# Patient Record
Sex: Female | Born: 1945
Health system: Southern US, Community
[De-identification: ages and names within clinical notes are randomized; demographics above are authoritative.]

## PROBLEM LIST (undated history)

## (undated) DIAGNOSIS — E785 Hyperlipidemia, unspecified: Secondary | ICD-10-CM

## (undated) DIAGNOSIS — Z973 Presence of spectacles and contact lenses: Secondary | ICD-10-CM

## (undated) DIAGNOSIS — G629 Polyneuropathy, unspecified: Secondary | ICD-10-CM

## (undated) DIAGNOSIS — I10 Essential (primary) hypertension: Secondary | ICD-10-CM

## (undated) DIAGNOSIS — R202 Paresthesia of skin: Secondary | ICD-10-CM

## (undated) DIAGNOSIS — H269 Unspecified cataract: Secondary | ICD-10-CM

## (undated) DIAGNOSIS — K635 Polyp of colon: Secondary | ICD-10-CM

## (undated) DIAGNOSIS — M1990 Unspecified osteoarthritis, unspecified site: Secondary | ICD-10-CM

## (undated) DIAGNOSIS — K5790 Diverticulosis of intestine, part unspecified, without perforation or abscess without bleeding: Secondary | ICD-10-CM

## (undated) DIAGNOSIS — K219 Gastro-esophageal reflux disease without esophagitis: Secondary | ICD-10-CM

## (undated) HISTORY — PX: COLONOSCOPY: SHX174

## (undated) HISTORY — DX: Unspecified osteoarthritis, unspecified site: M19.90

## (undated) HISTORY — DX: Polyp of colon: K63.5

## (undated) HISTORY — DX: Unspecified cataract: H26.9

## (undated) HISTORY — DX: Paresthesia of skin: R20.2

## (undated) HISTORY — PX: OTHER SURGICAL HISTORY: SHX169

## (undated) HISTORY — DX: Essential (primary) hypertension: I10

## (undated) HISTORY — DX: Hyperlipidemia, unspecified: E78.5

## (undated) HISTORY — PX: HAND SURGERY: SHX662

## (undated) HISTORY — DX: Diverticulosis of intestine, part unspecified, without perforation or abscess without bleeding: K57.90

---

## 1985-11-11 HISTORY — PX: ABDOMINAL HYSTERECTOMY: SHX81

## 1998-02-23 ENCOUNTER — Encounter: Admission: RE | Admit: 1998-02-23 | Discharge: 1998-05-24 | Payer: Self-pay | Admitting: Internal Medicine

## 1999-07-10 ENCOUNTER — Other Ambulatory Visit: Admission: RE | Admit: 1999-07-10 | Discharge: 1999-07-10 | Payer: Self-pay | Admitting: Family Medicine

## 2000-02-01 ENCOUNTER — Encounter: Admission: RE | Admit: 2000-02-01 | Discharge: 2000-02-01 | Payer: Self-pay | Admitting: Family Medicine

## 2000-02-01 ENCOUNTER — Encounter: Payer: Self-pay | Admitting: Family Medicine

## 2001-02-02 ENCOUNTER — Encounter: Payer: Self-pay | Admitting: Family Medicine

## 2001-02-02 ENCOUNTER — Encounter: Admission: RE | Admit: 2001-02-02 | Discharge: 2001-02-02 | Payer: Self-pay | Admitting: Family Medicine

## 2002-02-08 ENCOUNTER — Encounter: Admission: RE | Admit: 2002-02-08 | Discharge: 2002-02-08 | Payer: Self-pay | Admitting: Family Medicine

## 2002-02-08 ENCOUNTER — Encounter: Payer: Self-pay | Admitting: Family Medicine

## 2002-02-12 ENCOUNTER — Encounter: Admission: RE | Admit: 2002-02-12 | Discharge: 2002-02-12 | Payer: Self-pay | Admitting: Family Medicine

## 2002-02-12 ENCOUNTER — Encounter: Payer: Self-pay | Admitting: Family Medicine

## 2003-03-14 ENCOUNTER — Encounter: Payer: Self-pay | Admitting: Family Medicine

## 2003-03-14 ENCOUNTER — Encounter: Admission: RE | Admit: 2003-03-14 | Discharge: 2003-03-14 | Payer: Self-pay | Admitting: Family Medicine

## 2003-06-24 ENCOUNTER — Other Ambulatory Visit: Admission: RE | Admit: 2003-06-24 | Discharge: 2003-06-24 | Payer: Self-pay | Admitting: Family Medicine

## 2003-09-16 ENCOUNTER — Encounter: Payer: Self-pay | Admitting: Internal Medicine

## 2003-09-16 DIAGNOSIS — K5730 Diverticulosis of large intestine without perforation or abscess without bleeding: Secondary | ICD-10-CM | POA: Insufficient documentation

## 2003-09-16 DIAGNOSIS — D126 Benign neoplasm of colon, unspecified: Secondary | ICD-10-CM | POA: Insufficient documentation

## 2004-04-10 ENCOUNTER — Encounter: Admission: RE | Admit: 2004-04-10 | Discharge: 2004-04-10 | Payer: Self-pay | Admitting: Family Medicine

## 2004-09-04 ENCOUNTER — Other Ambulatory Visit: Admission: RE | Admit: 2004-09-04 | Discharge: 2004-09-04 | Payer: Self-pay | Admitting: Family Medicine

## 2004-09-10 ENCOUNTER — Encounter: Admission: RE | Admit: 2004-09-10 | Discharge: 2004-09-10 | Payer: Self-pay | Admitting: Family Medicine

## 2005-10-28 ENCOUNTER — Encounter: Admission: RE | Admit: 2005-10-28 | Discharge: 2005-10-28 | Payer: Self-pay | Admitting: Family Medicine

## 2006-11-06 ENCOUNTER — Encounter: Admission: RE | Admit: 2006-11-06 | Discharge: 2006-11-06 | Payer: Self-pay | Admitting: Family Medicine

## 2007-09-05 ENCOUNTER — Emergency Department (HOSPITAL_COMMUNITY): Admission: EM | Admit: 2007-09-05 | Discharge: 2007-09-05 | Payer: Self-pay | Admitting: Emergency Medicine

## 2007-09-09 ENCOUNTER — Encounter: Admission: RE | Admit: 2007-09-09 | Discharge: 2007-09-09 | Payer: Self-pay | Admitting: Internal Medicine

## 2007-12-04 ENCOUNTER — Encounter: Admission: RE | Admit: 2007-12-04 | Discharge: 2007-12-04 | Payer: Self-pay | Admitting: *Deleted

## 2008-10-19 DIAGNOSIS — E785 Hyperlipidemia, unspecified: Secondary | ICD-10-CM | POA: Insufficient documentation

## 2008-10-19 DIAGNOSIS — E119 Type 2 diabetes mellitus without complications: Secondary | ICD-10-CM | POA: Insufficient documentation

## 2008-10-24 ENCOUNTER — Ambulatory Visit: Payer: Self-pay | Admitting: Internal Medicine

## 2008-10-24 DIAGNOSIS — Z860100 Personal history of colon polyps, unspecified: Secondary | ICD-10-CM | POA: Insufficient documentation

## 2008-10-24 DIAGNOSIS — R197 Diarrhea, unspecified: Secondary | ICD-10-CM | POA: Insufficient documentation

## 2008-10-25 ENCOUNTER — Telehealth: Payer: Self-pay | Admitting: Internal Medicine

## 2008-10-27 ENCOUNTER — Ambulatory Visit: Payer: Self-pay | Admitting: Internal Medicine

## 2008-12-09 ENCOUNTER — Encounter: Admission: RE | Admit: 2008-12-09 | Discharge: 2008-12-09 | Payer: Self-pay | Admitting: Internal Medicine

## 2009-10-26 DIAGNOSIS — I10 Essential (primary) hypertension: Secondary | ICD-10-CM | POA: Insufficient documentation

## 2009-12-19 ENCOUNTER — Encounter: Admission: RE | Admit: 2009-12-19 | Discharge: 2009-12-19 | Payer: Self-pay | Admitting: Internal Medicine

## 2010-02-26 ENCOUNTER — Ambulatory Visit: Payer: Self-pay | Admitting: Surgery

## 2010-06-25 ENCOUNTER — Ambulatory Visit: Payer: Self-pay | Admitting: Surgery

## 2010-09-14 DIAGNOSIS — M179 Osteoarthritis of knee, unspecified: Secondary | ICD-10-CM | POA: Insufficient documentation

## 2010-12-04 ENCOUNTER — Other Ambulatory Visit: Payer: Self-pay | Admitting: Internal Medicine

## 2010-12-04 DIAGNOSIS — Z1239 Encounter for other screening for malignant neoplasm of breast: Secondary | ICD-10-CM

## 2010-12-27 ENCOUNTER — Ambulatory Visit
Admission: RE | Admit: 2010-12-27 | Discharge: 2010-12-27 | Disposition: A | Discharge status: Home / Self Care | Payer: BC Managed Care – PPO | Source: Ambulatory Visit | Attending: Internal Medicine | Admitting: Internal Medicine

## 2010-12-27 DIAGNOSIS — Z1239 Encounter for other screening for malignant neoplasm of breast: Secondary | ICD-10-CM

## 2011-02-05 DIAGNOSIS — Z01818 Encounter for other preprocedural examination: Secondary | ICD-10-CM | POA: Insufficient documentation

## 2011-03-26 NOTE — Assessment & Plan Note (Signed)
OFFICE VISIT   Cindy Lester, LOGALBO B  DOB:  Jun 06, 1946                                       02/26/2010  [id number]#:[id number]   REASON FOR VISIT:  Leg pain.   HISTORY:  This is a 65 year old female seen at the request of Dr.  Jacky Kindle for evaluation of leg pain.  The patient states that she began  having trouble her in legs approximately 2 to 3 years ago.  She  describes this as a burning numbness of the lateral aspects of her legs.  This extends down into her feet.  The right side is worse than the left.  Since August, this has been becoming progressively worse.  She states  that she can do most exercises and can walk approximately three-quarters  of a mile before she has symptoms.  She is unable to do activities that  start off at high intensity such as Zumba class that she likes to take  part in.  This is not lifestyle limiting for her.  She denies having any  rest pain or ulcerations.   The patient suffers from diabetes beginning at age 67.  She is also  being medically managed for hypertension and hypercholesterolemia.  She  has not been a smoker.   REVIEW OF SYSTEMS:  VASCULAR:  Positive for pain in legs with walking.  MUSCULOSKELETAL:  Positive for pain in her hands and numbness in her  hands occasionally.   All other review of systems are negative.   PAST MEDICAL HISTORY:  Hypertension, hypercholesterolemia, diabetes.   FAMILY HISTORY:  Positive for cardiovascular disease at an early age in  her father.   SOCIAL HISTORY:  She is married.  Works as a Airline pilot.  Does not drink  or smoke.   ALLERGIES:  Codeine and adhesive tape.   PHYSICAL EXAMINATION:  Heart rate 81, blood pressure 105/67, O2 sats  100%  General:  She is well-appearing, no distress.  HEENT:  Within  normal limits.  Lungs:  Clear bilaterally.  Cardiovascular:  Regular  rate and rhythm.  No carotid bruit.  She has palpable dorsalis pedis  pulses bilaterally.  Abdomen:  Obese and  soft.  Neuro:  She has no focal  weaknesses.  Skin:  Without rash.   DIAGNOSTIC STUDIES:  The patient comes with a ultrasound, ankle brachial  indices were unable to be performed secondary to calcified vessels.  She  has biphasic to triphasic waveforms bilaterally.   ASSESSMENT AND PLAN:  Bilateral leg pain.   PLAN:  Based on he patient's ultrasound, I am not convinced that her  symptoms are 100% related to poor circulation.  The patient does have  palpable dorsalis pedis pulses bilaterally which would argue against her  symptoms being related to vascular insufficiency.  Regardless, the fact  that her symptoms come about with exercise and go away with rest,  certainly could be explained by arterial insufficiency.  Regardless, at  this point in time her symptoms are not lifestyle limiting and so I do  not recommend any invasive treatment at this time.  I have recommended  to her that we try her on a course of cilostazol to see if she sees any  immediate benefit.  I will have her come back to see me in 3 months for  followup here.     Juleen China  IV, MD  Electronically Signed   VWB/MEDQ  D:  02/26/2010  T:  02/27/2010  Job:  [id number]   cc:   Geoffry Paradise, MD

## 2011-03-26 NOTE — Assessment & Plan Note (Signed)
OFFICE VISIT   Cindy Lester, Cindy Lester  DOB:  11/08/1946                                       06/25/2010  [id number]#:[id number]   REASON FOR VISIT:  Follow-up.   HISTORY:  This is a 65 year old female that I initially saw for Dr.  Jacky Kindle in April for leg pain.  She stated her right leg bothered her  worse than her left and it had become progressively worse.  She did  maintain, however a high level of activity being able to ambulate three-  quarters of a mile before her symptoms occurred.  She has been diabetic  since age 61.  She also suffers from hypertension and  hypercholesterolemia.  She denies having any symptoms of neuropathy.  I  placed her on cilostazol as I was not 100% convinced that her symptoms  were arterial in origin.  She comes in today for follow-up.  She states  that she has had improvement in her symptoms with the cilostazol.  She  not sure whether it is because her activity level has changed or whether  the medication is working, but nonetheless, she is not having any  problems.   PHYSICAL EXAMINATION:  Heart rate is 101, blood pressure 127/74, O2  saturations 90%.  General:  Well-appearing, no distress.  HEENT:  Within  normal limits.  Respirations nonlabored.  Extremities:  Warm, well-  perfused.  Cardiovascular:  She has palpable pedal pulses.  Skin:  Without rash.   ASSESSMENT/PLAN:  Bilateral leg pain, right greater than left.   PLAN:  The patient responded to the cilostazol.  Again, I am still not  convinced that her symptoms are 100% arterial in etiology.  Nonetheless  she is asymptomatic at this point in time.  I think we will continue  down our current path.  I have encouraged her to continue with her  exercise regimen.  I will plan on seeing her back in 1 year with  arterial ultrasound.     Jorge Ny, MD  Electronically Signed   VWB/MEDQ  D:  06/25/2010  T:  06/26/2010  Job:  [id number]   cc:   Dr. Jacky Kindle

## 2011-06-03 ENCOUNTER — Encounter: Payer: Self-pay | Admitting: Surgery

## 2011-06-24 ENCOUNTER — Ambulatory Visit: Payer: Self-pay | Admitting: Surgery

## 2011-08-16 LAB — GLUCOSE, CAPILLARY
Glucose-Capillary: 120 mg/dL — ABNORMAL HIGH (ref 70–99)
Glucose-Capillary: 87 mg/dL (ref 70–99)

## 2012-01-06 ENCOUNTER — Other Ambulatory Visit: Payer: Self-pay | Admitting: Internal Medicine

## 2012-01-06 DIAGNOSIS — Z1231 Encounter for screening mammogram for malignant neoplasm of breast: Secondary | ICD-10-CM

## 2012-01-24 ENCOUNTER — Ambulatory Visit
Admission: RE | Admit: 2012-01-24 | Discharge: 2012-01-24 | Disposition: A | Discharge status: Home / Self Care | Payer: BC Managed Care – PPO | Source: Ambulatory Visit | Attending: Internal Medicine | Admitting: Internal Medicine

## 2012-01-24 DIAGNOSIS — Z1231 Encounter for screening mammogram for malignant neoplasm of breast: Secondary | ICD-10-CM

## 2012-12-18 ENCOUNTER — Other Ambulatory Visit: Payer: Self-pay | Admitting: Internal Medicine

## 2012-12-18 DIAGNOSIS — Z1231 Encounter for screening mammogram for malignant neoplasm of breast: Secondary | ICD-10-CM

## 2013-01-25 ENCOUNTER — Ambulatory Visit
Admission: RE | Admit: 2013-01-25 | Discharge: 2013-01-25 | Disposition: A | Discharge status: Home / Self Care | Payer: BC Managed Care – PPO | Source: Ambulatory Visit | Attending: Internal Medicine | Admitting: Internal Medicine

## 2013-02-22 ENCOUNTER — Encounter: Payer: Self-pay | Admitting: Internal Medicine

## 2013-02-23 ENCOUNTER — Other Ambulatory Visit: Payer: Self-pay | Admitting: Orthopedic Surgery

## 2013-03-12 ENCOUNTER — Encounter (HOSPITAL_BASED_OUTPATIENT_CLINIC_OR_DEPARTMENT_OTHER): Payer: Self-pay | Admitting: *Deleted

## 2013-03-12 NOTE — Progress Notes (Signed)
To come in for bmet 03/16/13

## 2013-03-16 ENCOUNTER — Encounter (HOSPITAL_BASED_OUTPATIENT_CLINIC_OR_DEPARTMENT_OTHER)
Admission: RE | Admit: 2013-03-16 | Discharge: 2013-03-16 | Disposition: A | Discharge status: Home / Self Care | Payer: BC Managed Care – PPO | Source: Ambulatory Visit | Attending: Orthopedic Surgery | Admitting: Orthopedic Surgery

## 2013-03-16 LAB — BASIC METABOLIC PANEL
BUN: 28 mg/dL — ABNORMAL HIGH (ref 6–23)
Calcium: 10 mg/dL (ref 8.4–10.5)
GFR calc non Af Amer: 48 mL/min — ABNORMAL LOW (ref >90)
Glucose, Bld: 325 mg/dL — ABNORMAL HIGH (ref 70–99)

## 2013-03-16 NOTE — Progress Notes (Signed)
Dr. Merlyn Lot here and shown Glucose results 325 - plans to recheck in am - do not cancel.

## 2013-03-16 NOTE — Progress Notes (Signed)
Spoke with Cindy Courts RN at Dr. Cline Cools office and notified her of pt's glucose 325 and BUN 28. Larita Fife will let Dr. Merlyn Lot know and call me back.

## 2013-03-17 ENCOUNTER — Encounter (HOSPITAL_BASED_OUTPATIENT_CLINIC_OR_DEPARTMENT_OTHER): Payer: Self-pay | Admitting: Anesthesiology

## 2013-03-17 ENCOUNTER — Ambulatory Visit (HOSPITAL_BASED_OUTPATIENT_CLINIC_OR_DEPARTMENT_OTHER): Payer: BC Managed Care – PPO | Admitting: Anesthesiology

## 2013-03-17 ENCOUNTER — Ambulatory Visit (HOSPITAL_BASED_OUTPATIENT_CLINIC_OR_DEPARTMENT_OTHER)
Admission: RE | Admit: 2013-03-17 | Discharge: 2013-03-17 | Disposition: A | Discharge status: Home / Self Care | Payer: BC Managed Care – PPO | Source: Ambulatory Visit | Attending: Orthopedic Surgery | Admitting: Orthopedic Surgery

## 2013-03-17 ENCOUNTER — Encounter (HOSPITAL_BASED_OUTPATIENT_CLINIC_OR_DEPARTMENT_OTHER)
Admission: RE | Disposition: A | Discharge status: Home / Self Care | Payer: Self-pay | Source: Ambulatory Visit | Attending: Orthopedic Surgery

## 2013-03-17 DIAGNOSIS — Z8249 Family history of ischemic heart disease and other diseases of the circulatory system: Secondary | ICD-10-CM | POA: Insufficient documentation

## 2013-03-17 DIAGNOSIS — Z7982 Long term (current) use of aspirin: Secondary | ICD-10-CM | POA: Insufficient documentation

## 2013-03-17 DIAGNOSIS — Z79899 Other long term (current) drug therapy: Secondary | ICD-10-CM | POA: Insufficient documentation

## 2013-03-17 DIAGNOSIS — E119 Type 2 diabetes mellitus without complications: Secondary | ICD-10-CM | POA: Insufficient documentation

## 2013-03-17 DIAGNOSIS — Z9109 Other allergy status, other than to drugs and biological substances: Secondary | ICD-10-CM | POA: Insufficient documentation

## 2013-03-17 DIAGNOSIS — Z885 Allergy status to narcotic agent status: Secondary | ICD-10-CM | POA: Insufficient documentation

## 2013-03-17 DIAGNOSIS — G589 Mononeuropathy, unspecified: Secondary | ICD-10-CM | POA: Insufficient documentation

## 2013-03-17 DIAGNOSIS — M6740 Ganglion, unspecified site: Secondary | ICD-10-CM | POA: Insufficient documentation

## 2013-03-17 DIAGNOSIS — I10 Essential (primary) hypertension: Secondary | ICD-10-CM | POA: Insufficient documentation

## 2013-03-17 DIAGNOSIS — Z794 Long term (current) use of insulin: Secondary | ICD-10-CM | POA: Insufficient documentation

## 2013-03-17 DIAGNOSIS — E785 Hyperlipidemia, unspecified: Secondary | ICD-10-CM | POA: Insufficient documentation

## 2013-03-17 DIAGNOSIS — K219 Gastro-esophageal reflux disease without esophagitis: Secondary | ICD-10-CM | POA: Insufficient documentation

## 2013-03-17 HISTORY — DX: Polyneuropathy, unspecified: G62.9

## 2013-03-17 HISTORY — DX: Gastro-esophageal reflux disease without esophagitis: K21.9

## 2013-03-17 HISTORY — DX: Presence of spectacles and contact lenses: Z97.3

## 2013-03-17 HISTORY — PX: GANGLION CYST EXCISION: SHX1691

## 2013-03-17 LAB — GLUCOSE, CAPILLARY: Glucose-Capillary: 95 mg/dL (ref 70–99)

## 2013-03-17 SURGERY — EXCISION, GANGLION CYST, WRIST
Anesthesia: General | Site: Wrist | Laterality: Right | Wound class: Clean

## 2013-03-17 MED ORDER — OXYCODONE HCL 5 MG/5ML PO SOLN: 5.0000 mg | Freq: Once | ORAL | Status: AC | PRN

## 2013-03-17 MED ORDER — OXYCODONE HCL 5 MG PO TABS
5.0000 mg | ORAL_TABLET | Freq: Once | ORAL | Status: AC | PRN
  Administered 2013-03-17: 5 mg via ORAL

## 2013-03-17 MED ORDER — LACTATED RINGERS IV SOLN
INTRAVENOUS | Status: DC
  Administered 2013-03-17: 08:00:00 via INTRAVENOUS

## 2013-03-17 MED ORDER — MIDAZOLAM HCL 5 MG/5ML IJ SOLN
INTRAMUSCULAR | Status: DC | PRN
  Administered 2013-03-17: 2 mg via INTRAVENOUS

## 2013-03-17 MED ORDER — CHLORHEXIDINE GLUCONATE 4 % EX LIQD: 60.0000 mL | Freq: Once | CUTANEOUS | Status: DC

## 2013-03-17 MED ORDER — PROPOFOL 10 MG/ML IV BOLUS
INTRAVENOUS | Status: DC | PRN
Start: 2013-03-17 — End: 2013-03-17
  Administered 2013-03-17: 200 mg via INTRAVENOUS

## 2013-03-17 MED ORDER — ONDANSETRON HCL 4 MG/2ML IJ SOLN: 4.0000 mg | Freq: Once | INTRAMUSCULAR | Status: DC | PRN

## 2013-03-17 MED ORDER — LIDOCAINE HCL (CARDIAC) 20 MG/ML IV SOLN
INTRAVENOUS | Status: DC | PRN
  Administered 2013-03-17: 30 mg via INTRAVENOUS

## 2013-03-17 MED ORDER — HYDROMORPHONE HCL PF 1 MG/ML IJ SOLN: 0.2500 mg | INTRAMUSCULAR | Status: DC | PRN

## 2013-03-17 MED ORDER — BUPIVACAINE HCL (PF) 0.25 % IJ SOLN
INTRAMUSCULAR | Status: DC | PRN
  Administered 2013-03-17: 3 mL

## 2013-03-17 MED ORDER — FENTANYL CITRATE 0.05 MG/ML IJ SOLN
INTRAMUSCULAR | Status: DC | PRN
  Administered 2013-03-17: 50 ug via INTRAVENOUS

## 2013-03-17 MED ORDER — CEFAZOLIN SODIUM-DEXTROSE 2-3 GM-% IV SOLR
2.0000 g | INTRAVENOUS | Status: AC
  Administered 2013-03-17: 2 g via INTRAVENOUS

## 2013-03-17 MED ORDER — HYDROCODONE-ACETAMINOPHEN 5-325 MG PO TABS: 1.0000 | ORAL_TABLET | Freq: Four times a day (QID) | ORAL | Status: DC | PRN

## 2013-03-17 SURGICAL SUPPLY — 41 items
BANDAGE GAUZE ELAST BULKY 4 IN (GAUZE/BANDAGES/DRESSINGS) ×2 IMPLANT
BLADE MINI RND TIP GREEN BEAV (BLADE) IMPLANT
BLADE SURG 15 STRL LF DISP TIS (BLADE) ×1 IMPLANT
BLADE SURG 15 STRL SS (BLADE) ×2
BNDG CMPR 9X4 STRL LF SNTH (GAUZE/BANDAGES/DRESSINGS)
BNDG COHESIVE 3X5 TAN STRL LF (GAUZE/BANDAGES/DRESSINGS) ×2 IMPLANT
BNDG ESMARK 4X9 LF (GAUZE/BANDAGES/DRESSINGS) IMPLANT
CHLORAPREP W/TINT 26ML (MISCELLANEOUS) ×2 IMPLANT
CLOTH BEACON ORANGE TIMEOUT ST (SAFETY) ×2 IMPLANT
CORDS BIPOLAR (ELECTRODE) ×2 IMPLANT
COVER MAYO STAND STRL (DRAPES) ×2 IMPLANT
COVER TABLE BACK 60X90 (DRAPES) ×2 IMPLANT
CUFF TOURNIQUET SINGLE 18IN (TOURNIQUET CUFF) ×1 IMPLANT
DECANTER SPIKE VIAL GLASS SM (MISCELLANEOUS) IMPLANT
DRAPE EXTREMITY T 121X128X90 (DRAPE) ×2 IMPLANT
DRAPE SURG 17X23 STRL (DRAPES) ×2 IMPLANT
GAUZE XEROFORM 1X8 LF (GAUZE/BANDAGES/DRESSINGS) ×2 IMPLANT
GLOVE BIO SURGEON STRL SZ 6.5 (GLOVE) ×2 IMPLANT
GLOVE BIOGEL PI IND STRL 8.5 (GLOVE) ×1 IMPLANT
GLOVE BIOGEL PI INDICATOR 8.5 (GLOVE) ×1
GLOVE SURG ORTHO 8.0 STRL STRW (GLOVE) ×2 IMPLANT
GOWN BRE IMP PREV XXLGXLNG (GOWN DISPOSABLE) ×2 IMPLANT
GOWN PREVENTION PLUS XLARGE (GOWN DISPOSABLE) ×2 IMPLANT
NEEDLE 27GAX1X1/2 (NEEDLE) ×2 IMPLANT
NS IRRIG 1000ML POUR BTL (IV SOLUTION) ×2 IMPLANT
PACK BASIN DAY SURGERY FS (CUSTOM PROCEDURE TRAY) ×2 IMPLANT
PAD CAST 3X4 CTTN HI CHSV (CAST SUPPLIES) ×1 IMPLANT
PADDING CAST ABS 4INX4YD NS (CAST SUPPLIES) ×1
PADDING CAST ABS COTTON 4X4 ST (CAST SUPPLIES) ×1 IMPLANT
PADDING CAST COTTON 3X4 STRL (CAST SUPPLIES) ×2
SPLINT PLASTER CAST XFAST 3X15 (CAST SUPPLIES) ×5 IMPLANT
SPLINT PLASTER XTRA FASTSET 3X (CAST SUPPLIES) ×5
SPONGE GAUZE 4X4 12PLY (GAUZE/BANDAGES/DRESSINGS) ×2 IMPLANT
STOCKINETTE 4X48 STRL (DRAPES) ×2 IMPLANT
SUT VIC AB 4-0 P2 18 (SUTURE) IMPLANT
SUT VICRYL 4-0 PS2 18IN ABS (SUTURE) IMPLANT
SUT VICRYL RAPIDE 4/0 PS 2 (SUTURE) ×2 IMPLANT
SYR BULB 3OZ (MISCELLANEOUS) ×2 IMPLANT
SYR CONTROL 10ML LL (SYRINGE) ×2 IMPLANT
TOWEL OR 17X24 6PK STRL BLUE (TOWEL DISPOSABLE) ×3 IMPLANT
UNDERPAD 30X30 INCONTINENT (UNDERPADS AND DIAPERS) ×2 IMPLANT

## 2013-03-17 NOTE — Transfer of Care (Signed)
Immediate Anesthesia Transfer of Care Note  Patient: Cindy Lester  Procedure(s) Performed: Procedure(s): EXCISION MASS RIGHT WRIST (Right)  Patient Location: PACU  Anesthesia Type:General  Level of Consciousness: awake, oriented and patient cooperative  Airway & Oxygen Therapy: Patient Spontanous Breathing  Post-op Assessment: Report given to PACU RN and Post -op Vital signs reviewed and stable  Post vital signs: Reviewed and stable  Complications: No apparent anesthesia complications

## 2013-03-17 NOTE — Anesthesia Procedure Notes (Signed)
Procedure Name: LMA Insertion Date/Time: 03/17/2013 8:37 AM Performed by: Gar Gibbon Pre-anesthesia Checklist: Patient identified, Emergency Drugs available, Suction available and Patient being monitored Patient Re-evaluated:Patient Re-evaluated prior to inductionOxygen Delivery Method: Circle System Utilized Preoxygenation: Pre-oxygenation with 100% oxygen Intubation Type: IV induction Ventilation: Mask ventilation without difficulty LMA: LMA inserted LMA Size: 4.0 Number of attempts: 1 Airway Equipment and Method: bite block Placement Confirmation: positive ETCO2 Tube secured with: Tape Dental Injury: Teeth and Oropharynx as per pre-operative assessment

## 2013-03-17 NOTE — Progress Notes (Signed)
Patient states that insulin pump is on basal rate of 1.65units/hour.

## 2013-03-17 NOTE — Brief Op Note (Signed)
03/17/2013  9:19 AM  PATIENT:  Cindy Lester  67 y.o. female  PRE-OPERATIVE DIAGNOSIS:  VOLAR MASS RIGHT WRIST  POST-OPERATIVE DIAGNOSIS:  VOLAR MASS RIGHT WRIST  PROCEDURE:  Procedure(s): EXCISION MASS RIGHT WRIST (Right)  SURGEON:  Surgeon(s) and Role:    * Nicki Reaper, MD - Primary  PHYSICIAN ASSISTANT:   ASSISTANTS: none   ANESTHESIA:   local and general  EBL:  Total I/O In: 600 [I.V.:600] Out: -   BLOOD ADMINISTERED:none  DRAINS: none   LOCAL MEDICATIONS USED:  MARCAINE     SPECIMEN:  Excision  DISPOSITION OF SPECIMEN:  PATHOLOGY  COUNTS:  YES  TOURNIQUET:  * Missing tourniquet times found for documented tourniquets in log:  [id number] *  DICTATION: .Other Dictation: Dictation Number [id number]  PLAN OF CARE: Discharge to home after PACU  PATIENT DISPOSITION:  PACU - hemodynamically stable.

## 2013-03-17 NOTE — Anesthesia Preprocedure Evaluation (Signed)
Anesthesia Evaluation  Patient identified by MRN, date of birth, ID band Patient awake    Reviewed: Allergy & Precautions, H&P , NPO status , Patient's Chart, lab work & pertinent test results  Airway Mallampati: I TM Distance: >3 FB Neck ROM: Full    Dental  (+) Teeth Intact and Dental Advisory Given   Pulmonary  breath sounds clear to auscultation        Cardiovascular hypertension, Pt. on medications Rhythm:Regular Rate:Normal     Neuro/Psych    GI/Hepatic GERD-  Medicated and Controlled,  Endo/Other  diabetes, Well Controlled, Type 2, Insulin DependentOn insulin pump at basal rate.  Renal/GU      Musculoskeletal   Abdominal   Peds  Hematology   Anesthesia Other Findings   Reproductive/Obstetrics                           Anesthesia Physical Anesthesia Plan  ASA: II  Anesthesia Plan: General   Post-op Pain Management:    Induction: Intravenous  Airway Management Planned: LMA  Additional Equipment:   Intra-op Plan:   Post-operative Plan: Extubation in OR  Informed Consent: I have reviewed the patients History and Physical, chart, labs and discussed the procedure including the risks, benefits and alternatives for the proposed anesthesia with the patient or authorized representative who has indicated his/her understanding and acceptance.   Dental advisory given  Plan Discussed with: CRNA, Anesthesiologist and Surgeon  Anesthesia Plan Comments:         Anesthesia Quick Evaluation

## 2013-03-17 NOTE — H&P (Signed)
Cindy Lester is a 67 year old right hand dominant female with  a mass on the radial aspect of her wrist. This has been present for approximately 2 months. It has increased in size. It has not caused her pain. She has no history of injury. She does not like the appearance. She complains of it getting larger. She has not had any treatment. She has a history of diabetes insulin dependent. She has no history of thyroid problems, arthritis or gout. She is not complaining of any discomfort in her thumb or in her wrist.  PAST MEDICAL HISTORY: She is allergic to Codeine and adhesive tape. She is on insulin, Glucophage, INVOKANA, aspirin, calcium, vitamins and Lipitor. She has had a hysterectomy and laceration right palm as a child and apparently a subsequent infection.   FAMILY H ISTORY: Positive for high BP.  SOCIAL HISTORY: She does not smoke. She drinks socially. She is a professor at [employer].   REVIEW OF SYSTEMS: Positive for glasses, high BP otherwise negative. Cindy Lester is an 67 y.o. female.   Chief Complaint: Mass rt wrist HPI: see above  Past Medical History  Diagnosis Date  . Hypertension   . Hyperlipidemia   . Diabetes mellitus   . GERD (gastroesophageal reflux disease)     occ-no meds  . Wears glasses   . Neuropathy     Past Surgical History  Procedure Laterality Date  . Abdominal hysterectomy  1987  . Hand surgery      right-as child  . Colonoscopy      Family History  Problem Relation Age of Onset  . Coronary artery disease Father    Social History:  reports that she has never smoked. She has never used smokeless tobacco. She reports that she drinks about 8.4 ounces of alcohol per week. She reports that she does not use illicit drugs.  Allergies:  Allergies  Allergen Reactions  . Codeine Nausea And Vomiting  . Tape Other (See Comments)    Burns skin    Medications Prior to Admission  Medication Sig Dispense Refill  . aspirin 81 MG tablet Take 81 mg by mouth  daily.        Marland Kitchen atorvastatin (LIPITOR) 40 MG tablet Take 40 mg by mouth daily.       . calcium-vitamin D (OSCAL WITH D) 500-200 MG-UNIT per tablet Take 1 tablet by mouth daily.      . Canagliflozin (INVOKANA) 100 MG TABS Take by mouth.      . co-enzyme Q-10 50 MG capsule Take 200 mg by mouth daily.      . fish oil-omega-3 fatty acids 1000 MG capsule Take 2 g by mouth daily.      . furosemide (LASIX) 40 MG tablet Take 40 mg by mouth daily.       Marland Kitchen glucosamine-chondroitin 500-400 MG tablet Take 1 tablet by mouth 3 (three) times daily.      . insulin regular (NOVOLIN R,HUMULIN R) 100 units/mL injection Inject into the skin as directed.      . metFORMIN (GLUMETZA) 500 MG (MOD) 24 hr tablet Take 500 mg by mouth daily with breakfast.       . Multiple Vitamin (MULTIVITAMIN) capsule Take 1 capsule by mouth daily.        . naproxen sodium (ANAPROX) 220 MG tablet Take 220 mg by mouth 2 (two) times daily with a meal.      . Resveratrol 50 MG CAPS Take by mouth.      Marland Kitchen  valsartan (DIOVAN) 160 MG tablet Take 160 mg by mouth daily.      . vitamin B-12 (CYANOCOBALAMIN) 1000 MCG tablet Take 1,000 mcg by mouth daily.        Results for orders placed during the hospital encounter of 03/17/13 (from the past 48 hour(s))  BASIC METABOLIC PANEL     Status: Abnormal   Collection Time    03/16/13  9:00 AM      Result Value Range   Sodium 138  135 - 145 mEq/L   Potassium 4.6  3.5 - 5.1 mEq/L   Chloride 100  96 - 112 mEq/L   CO2 29  19 - 32 mEq/L   Glucose, Bld 325 (*) 70 - 99 mg/dL   BUN 28 (*) 6 - 23 mg/dL   Creatinine, Ser 1.61 (*) 0.50 - 1.10 mg/dL   Calcium 09.6  8.4 - 04.5 mg/dL   GFR calc non Af Amer 48 (*) >90 mL/min   GFR calc Af Amer 55 (*) >90 mL/min   Comment:            The eGFR has been calculated     using the CKD EPI equation.     This calculation has not been     validated in all clinical     situations.     eGFR's persistently     <90 mL/min signify     possible Chronic Kidney Disease.   GLUCOSE, CAPILLARY     Status: Abnormal   Collection Time    03/17/13  7:25 AM      Result Value Range   Glucose-Capillary 100 (*) 70 - 99 mg/dL  POCT HEMOGLOBIN-HEMACUE     Status: None   Collection Time    03/17/13  7:31 AM      Result Value Range   Hemoglobin 12.7  12.0 - 15.0 g/dL    No results found.   Pertinent items are noted in HPI.  Blood pressure 136/76, pulse 82, temperature 98.5 F (36.9 C), temperature source Oral, resp. rate 16, height 5' (1.524 m), weight 88.168 kg (194 lb 6 oz), SpO2 98.00%.  General appearance: alert, cooperative and appears stated age Head: Normocephalic, without obvious abnormality Neck: no JVD Resp: clear to auscultation bilaterally Cardio: regular rate and rhythm, S1, S2 normal, no murmur, click, rub or gallop GI: soft, non-tender; bowel sounds normal; no masses,  no organomegaly Extremities: extremities normal, atraumatic, no cyanosis or edema Pulses: 2+ and symmetric Skin: Skin color, texture, turgor normal. No rashes or lesions Neurologic: Grossly normal Incision/Wound: na  Assessment/Plan X-rays reveal slight rotation of the scaphoid, otherwise negative.  Diagnosis: Ganglion cyst right wrist.  It is difficult to determine whether this is volar or dorsal in origin. She would like to have it removed. We have discussed various treatment alternatives including observation, aspiration, splinting, anti-inflammatories or surgical excision along with risks and complications. She is aware that the potential for recurrence with surgical intervention, injury to arteries, nerves, tendons, incomplete relief of symptoms and dystrophy. She would like to proceed to have this excised. She is scheduled for excision cyst right wrist as an outpatient under regional anesthesia.  Cindy Lester R 03/17/2013, 7:48 AM

## 2013-03-17 NOTE — Anesthesia Postprocedure Evaluation (Signed)
  Anesthesia Post-op Note  Patient: Cindy Lester  Procedure(s) Performed: Procedure(s): EXCISION MASS RIGHT WRIST (Right)  Patient Location: PACU  Anesthesia Type:General  Level of Consciousness: awake, alert  and oriented  Airway and Oxygen Therapy: Patient Spontanous Breathing  Post-op Pain: mild  Post-op Assessment: Post-op Vital signs reviewed  Post-op Vital Signs: Reviewed  Complications: No apparent anesthesia complications

## 2013-03-17 NOTE — Op Note (Signed)
Dictated number; [id number]

## 2013-03-18 ENCOUNTER — Encounter (HOSPITAL_BASED_OUTPATIENT_CLINIC_OR_DEPARTMENT_OTHER): Payer: Self-pay | Admitting: Orthopedic Surgery

## 2013-03-18 NOTE — Op Note (Signed)
Cindy Lester, Cindy Lester               ACCOUNT NO.:  [id number]  MEDICAL RECORD NO.:  [id number]  LOCATION:                                 FACILITY:  PHYSICIAN:  Cindee Salt, M.D.       DATE OF BIRTH:  [birth year]  DATE OF PROCEDURE:  03/17/2013 DATE OF DISCHARGE:                              OPERATIVE REPORT   PREOPERATIVE DIAGNOSIS:  Volar radial wrist mass, right wrist.  POSTOPERATIVE DIAGNOSIS:  Volar radial wrist mass, right wrist.  OPERATION:  Excisional biopsy, probable ganglion cyst, right wrist.  SURGEON:  Cindee Salt, MD  ANESTHESIA:  General with local infiltration.  ANESTHESIOLOGIST:  Sheldon Silvan, M.D  HISTORY:  The patient is a 67 year old female with a history of a large mass, volar radial aspect of her right wrist.  She is desirous to having this excised.  Appears to be ganglion cyst.  She is aware that there is a potential for recurrence, injury to arteries, nerves, tendons, incomplete relief of symptoms, dystrophy, possibility of infection, 20% recurrence rate with cyst on the volar radial aspect.  In the preoperative area, the patient is seen, the extremity marked by both the patient and surgeon.  Antibiotic given.  PROCEDURE:  The patient was brought to the operating room where a general anesthetic was carried out without difficulty.  She was prepped using ChloraPrep, supine position, right arm free.  A 3-minute dry time was allowed.  Time-out taken, confirming the patient and procedure.  The limb was exsanguinated with an Esmarch bandage.  Tourniquet placed high on the arm was inflated to 250 mmHg.  A longitudinal incision was made on the radial aspect, volar side, right wrist, carried down through subcutaneous tissue.  Bleeders were electrocauterized with bipolar. Radial artery was identified, large cystic mass was immediately apparent.  With blunt and sharp dissection, this was dissected free followed down into the radiocarpal joint.  The area was opened.   The stalk removed.  The wound was copiously irrigated with saline.  The area of egress of the cyst was then sutured over with 4-0 Vicryl sutures.  A 4-0 Vicryl was placed in the subcutaneous tissue and the skin was closed with interrupted 4-0 Vicryl Rapide.  A sterile compressive dressing, wrist to the splint applied. On deflation of the tourniquet, all fingers immediately pinked.  She was taken to the recovery room for observation in satisfactory condition. She will be discharged home to return to the [facility] [place] in 1 week on [place].          ______________________________ Cindee Salt, M.D.     GK/MEDQ  D:  03/17/2013  T:  03/18/2013  Job:  [id number]

## 2013-03-24 ENCOUNTER — Encounter: Payer: Self-pay | Admitting: Internal Medicine

## 2013-03-24 ENCOUNTER — Ambulatory Visit (INDEPENDENT_AMBULATORY_CARE_PROVIDER_SITE_OTHER): Payer: BC Managed Care – PPO | Admitting: Internal Medicine

## 2013-03-24 VITALS — BP 124/60 | HR 88 | Ht 60.0 in | Wt 195.4 lb

## 2013-03-24 DIAGNOSIS — R195 Other fecal abnormalities: Secondary | ICD-10-CM

## 2013-03-24 DIAGNOSIS — E1059 Type 1 diabetes mellitus with other circulatory complications: Secondary | ICD-10-CM

## 2013-03-24 MED ORDER — MOVIPREP 100 G PO SOLR: 1.0000 | Freq: Once | ORAL | Status: DC

## 2013-03-24 NOTE — Patient Instructions (Addendum)

## 2013-03-24 NOTE — Progress Notes (Signed)
HISTORY OF PRESENT ILLNESS:  Cindy Lester is a 67 y.o. female with multiple medical problems, including insulin requiring diabetes mellitus, as listed below. She is sent today regarding Hemoccult-positive stool as noted during her recent annual evaluation with Dr. Jacky Kindle. I have seen the patient previously for colonoscopy in 2004 (non-adenomatous polyp) and 2009 (normal except for diverticulosis). Review of outside records shows Hemoccult-positive stool. Laboratories are unremarkable including normal hemoglobin of 12.6. Her GI review of systems is only remarkable for occasional reflux. She does take aspirin daily. Also, daily Aleve. Her chronic medical problems are stable. Diabetes under good control.  REVIEW OF SYSTEMS:  All non-GI ROS negative except for allergies, arthritis, back pain, hearing problems, increased urination, urinary leakage  Past Medical History  Diagnosis Date  . Hypertension   . Hyperlipidemia   . Diabetes mellitus   . GERD (gastroesophageal reflux disease)     occ-no meds  . Wears glasses   . Neuropathy   . Osteoarthritis   . Diverticulosis   . Colon polyps     Past Surgical History  Procedure Laterality Date  . Abdominal hysterectomy  1987  . Hand surgery Right     right-as child  . Colonoscopy    . Ganglion cyst excision Right 03/17/2013    Procedure: EXCISION MASS RIGHT WRIST;  Surgeon: Nicki Reaper, MD;  Location: [facility];  Service: Orthopedics;  Laterality: Right;    Social History Cindy Lester  reports that she has never smoked. She has never used smokeless tobacco. She reports that she drinks about 8.4 ounces of alcohol per week. She reports that she does not use illicit drugs.  family history includes Bladder Cancer in her father; Coronary artery disease in her father; and Melanoma in her father.  Allergies  Allergen Reactions  . Codeine Nausea And Vomiting  . Tape Other (See Comments)    Burns skin       PHYSICAL  EXAMINATION: Vital signs: BP 124/60  Pulse 88  Ht 5' (1.524 m)  Wt 195 lb 6 oz (88.622 kg)  BMI 38.16 kg/m2 General: Well-developed, obese, well-nourished, no acute distress HEENT: Sclerae are anicteric, conjunctiva pink. Oral mucosa intact Lungs: Clear Heart: Regular Abdomen: soft, obese, nontender, nondistended, no obvious ascites, no peritoneal signs, normal bowel sounds. No organomegaly. Extremities: No edema Psychiatric: alert and oriented x3. Cooperative   ASSESSMENT:  #1. Hemoccult-positive stool. Rule out interval neoplasia. Rule out NSAID-induced lesion #2. Multiple medical problems including insulin requiring diabetes   PLAN:  #1. Colonoscopy and upper endoscopy to evaluate Hemoccult-positive stool.The nature of the procedure, as well as the risks, benefits, and alternatives were carefully and thoroughly reviewed with the patient. Ample time for discussion and questions allowed. The patient understood, was satisfied, and agreed to proceed. 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 She will discontinue Aleve and use Tylenol for pain in the interim. Movi prep prescribed. The patient instructed on its use. #2. She will talk to Dr. Jacky Kindle regarding adjustments of her insulin for the procedure to avoid and wanted hypoglycemia. As well, she will hold metformin that day.

## 2013-03-25 ENCOUNTER — Encounter: Payer: Self-pay | Admitting: Internal Medicine

## 2013-05-18 ENCOUNTER — Encounter: Payer: Self-pay | Admitting: Internal Medicine

## 2013-05-18 ENCOUNTER — Ambulatory Visit (AMBULATORY_SURGERY_CENTER): Payer: BC Managed Care – PPO | Admitting: Internal Medicine

## 2013-05-18 VITALS — BP 149/73 | HR 68 | Temp 98.1°F | Resp 18 | Ht 60.0 in | Wt 195.0 lb

## 2013-05-18 DIAGNOSIS — R195 Other fecal abnormalities: Secondary | ICD-10-CM

## 2013-05-18 LAB — GLUCOSE, CAPILLARY
Glucose-Capillary: 125 mg/dL — ABNORMAL HIGH (ref 70–99)
Glucose-Capillary: 81 mg/dL (ref 70–99)
Glucose-Capillary: 73 mg/dL (ref 70–99)
Glucose-Capillary: 81 mg/dL (ref 70–99)
Glucose-Capillary: 96 mg/dL (ref 70–99)

## 2013-05-18 MED ORDER — SODIUM CHLORIDE 0.9 % IV SOLN: 500.0000 mL | INTRAVENOUS | Status: DC

## 2013-05-18 NOTE — Patient Instructions (Addendum)
YOU HAD AN ENDOSCOPIC PROCEDURE TODAY AT THE [facility]: Refer to the procedure report that was given to you for any specific questions about what was found during the examination.  If the procedure report does not answer your questions, please call your gastroenterologist to clarify.  If you requested that your care partner not be given the details of your procedure findings, then the procedure report has been included in a sealed envelope for you to review at your convenience later.  YOU SHOULD EXPECT: Some feelings of bloating in the abdomen. Passage of more gas than usual.  Walking can help get rid of the air that was put into your GI tract during the procedure and reduce the bloating. If you had a lower endoscopy (such as a colonoscopy or flexible sigmoidoscopy) you may notice spotting of blood in your stool or on the toilet paper. If you underwent a bowel prep for your procedure, then you may not have a normal bowel movement for a few days.  DIET: Your first meal following the procedure should be a light meal and then it is ok to progress to your normal diet.  A half-sandwich or bowl of soup is an example of a good first meal.  Heavy or fried foods are harder to digest and may make you feel nauseous or bloated.  Likewise meals heavy in dairy and vegetables can cause extra gas to form and this can also increase the bloating.  Drink plenty of fluids but you should avoid alcoholic beverages for 24 hours.  Try to eat more fiber in your diet, and take your medicine as directed.  ACTIVITY: Your care partner should take you home directly after the procedure.  You should plan to take it easy, moving slowly for the rest of the day.  You can resume normal activity the day after the procedure however you should NOT DRIVE or use heavy machinery for 24 hours (because of the sedation medicines used during the test).    SYMPTOMS TO REPORT IMMEDIATELY: A gastroenterologist can be reached at any hour.   During normal business hours, 8:30 AM to 5:00 PM Monday through Friday, call [phone].  After hours and on weekends, please call the GI answering service at [phone] who will take a message and have the physician on call contact you.   Following lower endoscopy (colonoscopy or flexible sigmoidoscopy):  Excessive amounts of blood in the stool  Significant tenderness or worsening of abdominal pains  Swelling of the abdomen that is new, acute  Fever of 100F or higher  Following upper endoscopy (EGD)  Vomiting of blood or coffee ground material  New chest pain or pain under the shoulder blades  Painful or persistently difficult swallowing  New shortness of breath  Fever of 100F or higher  Black, tarry-looking stools  FOLLOW UP: If any biopsies were taken you will be contacted by phone or by letter within the next 1-3 weeks.  Call your gastroenterologist if you have not heard about the biopsies in 3 weeks.  Our staff will call the home number listed on your records the next business day following your procedure to check on you and address any questions or concerns that you may have at that time regarding the information given to you following your procedure. This is a courtesy call and so if there is no answer at the home number and we have not heard from you through the emergency physician on call, we will assume that you  have returned to your regular daily activities without incident.  SIGNATURES/CONFIDENTIALITY: You and/or your care partner have signed paperwork which will be entered into your electronic medical record.  These signatures attest to the fact that that the information above on your After Visit Summary has been reviewed and is understood.  Full responsibility of the confidentiality of this discharge information lies with you and/or your care-partner.

## 2013-05-18 NOTE — Op Note (Signed)
[facility] [address] [place], [address]   ENDOSCOPY PROCEDURE REPORT  PATIENT: Brittlyn, Cloe  MR#: [id number] BIRTHDATE: 05-25-1946 , 67  yrs. old GENDER: Female ENDOSCOPIST: Roxy Cedar, MD REFERRED BY:  Geoffry Paradise, M.D. PROCEDURE DATE:  05/18/2013 PROCEDURE:  EGD, diagnostic ASA CLASS:     Class II INDICATIONS:  Heme positive stool. MEDICATIONS: MAC sedation, administered by CRNA and propofol (Diprivan) 100mg  IV TOPICAL ANESTHETIC: none  DESCRIPTION OF PROCEDURE: After the risks benefits and alternatives of the procedure were thoroughly explained, informed consent was obtained.  The LB [id number] endoscope was introduced through the mouth and advanced to the second portion of the duodenum. Without limitations.  The instrument was slowly withdrawn as the mucosa was fully examined.      EXAM:   Mild erosive esophagitis at Z-line.  No Barrett's or stricture.  Normal stomach and duodenum.  Retroflexed views revealed no abnormalities.     The scope was then withdrawn from the patient and the procedure completed.  COMPLICATIONS: There were no complications.  ENDOSCOPIC IMPRESSION: 1. GERD with Mild erosive esophagitis. May cause heme + stool 2. Otherwise normal EGD  RECOMMENDATIONS: 1.  Anti-reflux regimen to be followed 2.  Prilosec OTC 20 mg daily for 4 weeks. Then use on demand (as needed) 3. GI follow up prn  REPEAT EXAM:  eSigned:  Roxy Cedar, MD 05/18/2013 12:09 PM   FA:[id number] Jacky Kindle, MD and The Patient

## 2013-05-18 NOTE — Progress Notes (Signed)
Lidocaine-40mg IV prior to Propofol InductionPropofol given over incremental dosages 

## 2013-05-18 NOTE — Progress Notes (Signed)
Juline Patch, RN took pt's blood sugar and it was 81 in the recovery room.  Pt is asymptomatic and awake now, Juline Patch, RN gave her grape juice to drink po.  Juline Patch, RN was given report on the pt and she will resume care of the pt. Maw

## 2013-05-18 NOTE — Op Note (Signed)
[facility] [address] [place], [address]   COLONOSCOPY PROCEDURE REPORT  PATIENT: Cindy Lester, Cindy Lester  MR#: [id number] BIRTHDATE: [birth year] , 67  yrs. old GENDER: Female ENDOSCOPIST: Roxy Cedar, MD REFERRED AO:[id number] Jacky Kindle, M.D. PROCEDURE DATE:  05/18/2013 PROCEDURE:   Colonoscopy, diagnostic ASA CLASS:   Class II INDICATIONS:heme-positive stool.   Prior colon 2004 (nonadenoma); 2009 (diverticulosis only) MEDICATIONS: MAC sedation, administered by CRNA and propofol (Diprivan) 150mg  IV  DESCRIPTION OF PROCEDURE:   After the risks benefits and alternatives of the procedure were thoroughly explained, informed consent was obtained.  A digital rectal exam revealed no abnormalities of the rectum.   The LB [id number]  endoscope was introduced through the anus and advanced to the cecum, which was identified by both the appendix and ileocecal valve. No adverse events experienced.   The quality of the prep was excellent, using MoviPrep  The instrument was then slowly withdrawn as the colon was fully examined.      COLON FINDINGS: The mucosa appeared normal in the terminal ileum. Moderate diverticulosis was noted throughout the entire examined colon.   The colon mucosa was otherwise normal.  Retroflexed views revealed internal hemorrhoids. The time to cecum=3 minutes . Withdrawal time=9 minutes 04 seconds.  The scope was withdrawn and the procedure completed.  COMPLICATIONS: There were no complications.  ENDOSCOPIC IMPRESSION: 1.   Normal mucosa in the terminal ileum 2.   Moderate diverticulosis was noted throughout the entire examined colon 3.   The colon mucosa was otherwise normal  RECOMMENDATIONS: 1.  Continue current colorectal screening recommendations for "routine risk" patients with a repeat colonoscopy in 10 years. 2.  Upper endoscopy today (SEE REPORT)   eSigned:  Roxy Cedar, MD 05/18/2013 12:01 PM   cc: Geoffry Paradise,  MD and The Patient   PATIENT NAME:  Cindy Lester, Cindy Lester MR#: [id number]

## 2013-05-18 NOTE — Progress Notes (Signed)
Patient did not have preoperative order for IV antibiotic SSI prophylaxis. (G8918)  Patient did not experience any of the following events: a burn prior to discharge; a fall within the facility; wrong site/side/patient/procedure/implant event; or a hospital transfer or hospital admission upon discharge from the facility. (G8907)  

## 2013-05-19 ENCOUNTER — Telehealth: Payer: Self-pay | Admitting: *Deleted

## 2013-05-19 NOTE — Telephone Encounter (Signed)
  Follow up Call-  Call back number 05/18/2013  Post procedure Call Back phone  # [phone]  Permission to leave phone message Yes     Patient questions:  Do you have a fever, pain , or abdominal swelling? no Pain Score  0 *  Have you tolerated food without any problems? yes  Have you been able to return to your normal activities? yes  Do you have any questions about your discharge instructions: Diet   no Medications  no Follow up visit  no  Do you have questions or concerns about your Care? no  Actions: * If pain score is 4 or above: No action needed, pain <4.

## 2013-07-07 DIAGNOSIS — L400 Psoriasis vulgaris: Secondary | ICD-10-CM | POA: Insufficient documentation

## 2014-03-07 ENCOUNTER — Other Ambulatory Visit: Payer: Self-pay

## 2014-03-07 DIAGNOSIS — Z1231 Encounter for screening mammogram for malignant neoplasm of breast: Secondary | ICD-10-CM

## 2014-03-23 ENCOUNTER — Encounter (INDEPENDENT_AMBULATORY_CARE_PROVIDER_SITE_OTHER): Payer: Self-pay

## 2014-03-23 ENCOUNTER — Ambulatory Visit
Admission: RE | Admit: 2014-03-23 | Discharge: 2014-03-23 | Disposition: A | Discharge status: Home / Self Care | Payer: BC Managed Care – PPO | Source: Ambulatory Visit

## 2014-03-23 DIAGNOSIS — Z1231 Encounter for screening mammogram for malignant neoplasm of breast: Secondary | ICD-10-CM

## 2015-03-27 DIAGNOSIS — Z794 Long term (current) use of insulin: Secondary | ICD-10-CM | POA: Insufficient documentation

## 2015-03-30 ENCOUNTER — Encounter: Payer: Self-pay | Admitting: Internal Medicine

## 2015-04-04 ENCOUNTER — Other Ambulatory Visit: Payer: Self-pay

## 2015-04-04 DIAGNOSIS — Z1231 Encounter for screening mammogram for malignant neoplasm of breast: Secondary | ICD-10-CM

## 2015-04-13 ENCOUNTER — Ambulatory Visit
Admission: RE | Admit: 2015-04-13 | Discharge: 2015-04-13 | Disposition: A | Discharge status: Home / Self Care | Payer: Medicare Other | Source: Ambulatory Visit

## 2015-04-13 DIAGNOSIS — Z1231 Encounter for screening mammogram for malignant neoplasm of breast: Secondary | ICD-10-CM

## 2015-07-28 DIAGNOSIS — I129 Hypertensive chronic kidney disease with stage 1 through stage 4 chronic kidney disease, or unspecified chronic kidney disease: Secondary | ICD-10-CM | POA: Insufficient documentation

## 2016-04-19 ENCOUNTER — Other Ambulatory Visit: Payer: Self-pay | Admitting: Internal Medicine

## 2016-04-19 DIAGNOSIS — Z1231 Encounter for screening mammogram for malignant neoplasm of breast: Secondary | ICD-10-CM

## 2016-04-23 ENCOUNTER — Ambulatory Visit
Admission: RE | Admit: 2016-04-23 | Discharge: 2016-04-23 | Disposition: A | Discharge status: Home / Self Care | Payer: Medicare Other | Source: Ambulatory Visit | Attending: Internal Medicine | Admitting: Internal Medicine

## 2016-04-23 DIAGNOSIS — Z1231 Encounter for screening mammogram for malignant neoplasm of breast: Secondary | ICD-10-CM

## 2017-04-08 ENCOUNTER — Other Ambulatory Visit: Payer: Self-pay | Admitting: Internal Medicine

## 2017-04-08 DIAGNOSIS — Z1231 Encounter for screening mammogram for malignant neoplasm of breast: Secondary | ICD-10-CM

## 2017-04-25 ENCOUNTER — Ambulatory Visit
Admission: RE | Admit: 2017-04-25 | Discharge: 2017-04-25 | Disposition: A | Discharge status: Home / Self Care | Payer: Medicare Other | Source: Ambulatory Visit | Attending: Internal Medicine | Admitting: Internal Medicine

## 2017-04-25 DIAGNOSIS — Z1231 Encounter for screening mammogram for malignant neoplasm of breast: Secondary | ICD-10-CM

## 2018-02-03 ENCOUNTER — Encounter: Payer: Self-pay | Admitting: Neurology

## 2018-02-03 ENCOUNTER — Ambulatory Visit: Payer: Medicare Other | Admitting: Neurology

## 2018-02-03 VITALS — BP 149/68 | HR 68 | Ht 60.0 in | Wt 198.0 lb

## 2018-02-03 DIAGNOSIS — R202 Paresthesia of skin: Secondary | ICD-10-CM

## 2018-02-03 MED ORDER — GABAPENTIN 100 MG PO CAPS: 100.0000 mg | ORAL_CAPSULE | Freq: Every day | ORAL | 11 refills | Status: DC

## 2018-02-03 NOTE — Progress Notes (Signed)
PATIENT: Cindy Lester DOB: 1946/08/01  Chief Complaint  Patient presents with  . Tingling    Reports intermittent tingling sensations in arms and hands.  Symptoms have been present for the last year and occur bilaterally (although more often on left side).  Says she was referred here for NCV/EMG.  . PCP    Cindy Paradise, MD     HISTORICAL  Cindy Lester is a 72 year old female, seen in refer primary care physician CindyAronson, Lester for evaluation of paresthesia in her arms and hands, initial evaluation was on February 03, 2018.  I have reviewed and summarized the referring note, she has history of hypertension, diabetes, hyperlipidemia, neuroma of the right wrist in 2015, hypertension, diabetes, hyperlipidemia,osteopenia  Since 2018, she noticed intermittent bilateral hands paresthesia, involving left hand more, which is her nondominant hand, she complains of numbness tingling involving middle 3 fingers, getting worse while driving, she has to work returns to make the sensation comes back, sometimes woke her up from sleep with deep achy pain,  She complains of neck tension, denies significant neck pain, radiating pain to bilateral upper extremity, she denies bilateral feet paresthesia.  She has mild bilateral hand subjective weakness, difficulty making a tight fist in the morning time, also suffered osteoarthritis, with mild bilateral hands joints pain.  She practices yoga regularly,   Laboratory evaluations A1c 6.2 in January 2019, LDL was 83, GFR 59,    REVIEW OF SYSTEMS: Full 14 system review of systems performed and notable only for numbness, feeling cold, joint pain, blurry vision ALLERGIES: Allergies  Allergen Reactions  . Codeine Nausea And Vomiting  . Tape Other (See Comments)    Burns skin    HOME MEDICATIONS: Current Outpatient Medications  Medication Sig Dispense Refill  . aspirin 81 MG tablet Take 81 mg by mouth daily.      Marland Kitchen atorvastatin (LIPITOR) 40 MG  tablet Take 40 mg by mouth daily.     . calcium-vitamin D (OSCAL WITH D) 500-200 MG-UNIT per tablet Take 1 tablet by mouth daily.    . Canagliflozin (INVOKANA) 100 MG TABS Take by mouth.    . Cholecalciferol (VITAMIN D3 PO) Take by mouth daily.    Marland Kitchen CINNAMON PO Take by mouth daily.    Marland Kitchen co-enzyme Q-10 50 MG capsule Take 200 mg by mouth daily.    Marland Kitchen etodolac (LODINE) 400 MG tablet Take 400 mg by mouth daily.    . fish oil-omega-3 fatty acids 1000 MG capsule Take 2 g by mouth daily.    . furosemide (LASIX) 40 MG tablet Take 40 mg by mouth daily.     Marland Kitchen glucosamine-chondroitin 500-400 MG tablet Take 1 tablet by mouth 3 (three) times daily.    . insulin regular (NOVOLIN R,HUMULIN R) 100 units/mL injection Inject into the skin as directed.    Marland Kitchen MAGNESIUM-ZINC PO Take by mouth daily.    . metFORMIN (GLUMETZA) 500 MG (MOD) 24 hr tablet Take 500 mg by mouth daily with breakfast.     . Multiple Vitamin (MULTIVITAMIN) capsule Take 1 capsule by mouth daily.      Marland Kitchen Resveratrol 50 MG CAPS Take by mouth.    . valsartan (DIOVAN) 160 MG tablet Take 160 mg by mouth daily.    . vitamin B-12 (CYANOCOBALAMIN) 1000 MCG tablet Take 1,000 mcg by mouth daily.     No current facility-administered medications for this visit.     PAST MEDICAL HISTORY: Past Medical History:  Diagnosis Date  .  Colon polyps   . Diabetes mellitus   . Diverticulosis   . GERD (gastroesophageal reflux disease)    occ-no meds  . Hyperlipidemia   . Hypertension   . Neuropathy   . Osteoarthritis   . Tingling   . Wears glasses     PAST SURGICAL HISTORY: Past Surgical History:  Procedure Laterality Date  . ABDOMINAL HYSTERECTOMY  1987  . COLONOSCOPY    . GANGLION CYST EXCISION Right 03/17/2013   Procedure: EXCISION MASS RIGHT WRIST;  Surgeon: Nicki Reaper, MD;  Location: [facility];  Service: Orthopedics;  Laterality: Right;  . HAND SURGERY Right    right-as child    FAMILY HISTORY: Family History  Problem  Relation Age of Onset  . Coronary artery disease Father   . Bladder Cancer Father   . Melanoma Father   . Alcoholism Mother        died at age 56 in a fire - unsure of health history  . Breast cancer Neg Hx     SOCIAL HISTORY:  Social History   Socioeconomic History  . Marital status: Married    Spouse name: Not on file  . Number of children: 0  . Years of education: PhD  . Highest education level: Not on file  Occupational History  . Occupation: retired professor    Employer: [employer]  Social Needs  . Financial resource strain: Not on file  . Food insecurity:    Worry: Not on file    Inability: Not on file  . Transportation needs:    Medical: Not on file    Non-medical: Not on file  Tobacco Use  . Smoking status: Never Smoker  . Smokeless tobacco: Never Used  Substance and Sexual Activity  . Alcohol use: Yes    Comment: occaisonal wine  . Drug use: No  . Sexual activity: Not on file  Lifestyle  . Physical activity:    Days per week: Not on file    Minutes per session: Not on file  . Stress: Not on file  Relationships  . Social connections:    Talks on phone: Not on file    Gets together: Not on file    Attends religious service: Not on file    Active member of club or organization: Not on file    Attends meetings of clubs or organizations: Not on file    Relationship status: Not on file  . Intimate partner violence:    Fear of current or ex partner: Not on file    Emotionally abused: Not on file    Physically abused: Not on file    Forced sexual activity: Not on file  Other Topics Concern  . Not on file  Social History Narrative   Right-handed.   Caffeine use: 2 cups per day, one diet Coke at lunch.   Lives at home with husband.     PHYSICAL EXAM   Vitals:   02/03/18 1441  BP: (!) 149/68  Pulse: 68  Weight: 198 lb (89.8 kg)  Height: 5' 4.5" (1.638 m)    Not recorded      Body mass index is 33.46 kg/m.  PHYSICAL EXAMNIATION:  Gen: NAD,  conversant, well nourised, obese, well groomed                     Cardiovascular: Regular rate rhythm, no peripheral edema, warm, nontender. Eyes: Conjunctivae clear without exudates or hemorrhage Neck: Supple, no carotid bruits. Pulmonary: Clear  to auscultation bilaterally   NEUROLOGICAL EXAM:  MENTAL STATUS: Speech:    Speech is normal; fluent and spontaneous with normal comprehension.  Cognition:     Orientation to time, place and person     Normal recent and remote memory     Normal Attention span and concentration     Normal Language, naming, repeating,spontaneous speech     Fund of knowledge   CRANIAL NERVES: CN II: Visual fields are full to confrontation. Fundoscopic exam is normal with sharp discs and no vascular changes. Pupils are round equal and briskly reactive to light. CN III, IV, VI: extraocular movement are normal. No ptosis. CN V: Facial sensation is intact to pinprick in all 3 divisions bilaterally. Corneal responses are intact.  CN VII: Face is symmetric with normal eye closure and smile. CN VIII: Hearing is normal to rubbing fingers CN IX, X: Palate elevates symmetrically. Phonation is normal. CN XI: Head turning and shoulder shrug are intact CN XII: Tongue is midline with normal movements and no atrophy.  MOTOR: There is no pronator drift of out-stretched arms. Muscle bulk and tone are normal. Muscle strength is normal.  REFLEXES: Reflexes are 2+ and symmetric at the biceps, triceps, knees, and ankles. Plantar responses are flexor.  SENSORY: Intact to light touch, pinprick, positional sensation and vibratory sensation are intact in fingers and toes.  COORDINATION: Rapid alternating movements and fine finger movements are intact. There is no dysmetria on finger-to-nose and heel-knee-shin.    GAIT/STANCE: Posture is normal. Gait is steady with normal steps, base, arm swing, and turning. Heel and toe walking are normal. Tandem gait is normal.  Romberg is  absent.   DIAGNOSTIC DATA (LABS, IMAGING, TESTING) - I reviewed patient records, labs, notes, testing and imaging myself where available.   ASSESSMENT AND PLAN  TANJANIKA SJOBERG is a 72 y.o. female   Intermittent bilateral hands paresthesia  Most consistent with bilateral carpal tunnel syndromes, remote possibility of carpal tunnel syndromes  EMG nerve conduction study  Bilateral wrist the splint  As needed NSAIDs, gabapentin 100 mg every night   Levert Feinstein, M.D. Ph.D.  [facility] [address], [address] [place], [place] [address] Ph: [phone] Fax: [phone]  CC: Cindy Paradise, MD

## 2018-02-20 ENCOUNTER — Encounter: Payer: Medicare Other | Admitting: Neurology

## 2018-02-20 ENCOUNTER — Ambulatory Visit (INDEPENDENT_AMBULATORY_CARE_PROVIDER_SITE_OTHER): Payer: Medicare Other | Admitting: Neurology

## 2018-02-20 DIAGNOSIS — Z0289 Encounter for other administrative examinations: Secondary | ICD-10-CM

## 2018-02-20 DIAGNOSIS — R202 Paresthesia of skin: Secondary | ICD-10-CM

## 2018-02-20 NOTE — Procedures (Signed)
Full Name: Cindy Lester Gender: Female MRN #: [id number] Date of Birth: 2046-01-11    Visit Date: 02/20/18 09:02 Age: 72 Years 1 Months Old Examining Physician: Levert Feinstein, MD  Referring Physician: Terrace Arabia. MD History: 72 years old female presented with intermittent bilateral hands paresthesia.  Summary of the tests:  Nerve Conduction Study: Bilateral ulnar sensory motor responses were normal. Bilateral median sensory responses showed moderately prolonged peak latency, with moderately decreased snap amplitude. Bilateral median motor responses showed severely prolonged distal latency, left side has significantly decreased the C map amplitude, but normal conduction velocity.  Electromyography: Selective needle examinations were performed at right upper extremity muscles, left abductor pollicis brevis.  There is evidence of decreased recruitment patterns at bilateral abductor pollicis brevis muscles, with normal motor unit potential, no spontaneous activities.    Conclusion: This is an abnormal study.  There is electrodiagnostic evidence of bilateral median neuropathy across the wrist, consistent with bilateral carpal tunnel syndromes.  Left side is moderate to severe, right side is moderate, mainly demyelinating in nature.  There is no evidence of axonal loss.    ------------------------------- Levert Feinstein, M.D.  [facility] [address] [place], [place] [address] Tel: [phone] Fax: [phone]        [facility]    Nerve / Sites Muscle Latency Ref. Amplitude Ref. Rel Amp Segments Distance Velocity Ref. Area    ms ms mV mV %  cm m/s m/s mVms  L Median - APB     Wrist APB 5.7 ?4.4 1.6 ?4.0 100 Wrist - APB 7   6.4     Upper arm APB 9.2  1.4  90 Upper arm - Wrist 18 52 ?49 5.5  R Median - APB     Wrist APB 4.9 ?4.4 6.5 ?4.0 100 Wrist - APB 7   27.0     Upper arm APB 8.8  6.7  102 Upper arm - Wrist 19 49 ?49 26.8  L Ulnar - ADM     Wrist ADM 2.7 ?3.3 8.3  ?6.0 100 Wrist - ADM 7   20.4     B.Elbow ADM 5.8  7.2  86.4 B.Elbow - Wrist 16 52 ?49 18.7     A.Elbow ADM 7.6  7.4  103 A.Elbow - B.Elbow 10 55 ?49 21.0         A.Elbow - Wrist      R Ulnar - ADM     Wrist ADM 2.2 ?3.3 7.5 ?6.0 100 Wrist - ADM 7   18.8     B.Elbow ADM 5.6  6.6  88.6 B.Elbow - Wrist 17 50 ?49 17.8     A.Elbow ADM 7.5  6.5  98.6 A.Elbow - B.Elbow 10 53 ?49 17.9         A.Elbow - Wrist                 SNC    Nerve / Sites Rec. Site Peak Lat Ref.  Amp Ref. Segments Distance    ms ms V V  cm  L Median - Orthodromic (Dig II, Mid palm)     Dig II Wrist 6.9 ?3.4 4 ?10 Dig II - Wrist 13  R Median - Orthodromic (Dig II, Mid palm)     Dig II Wrist 4.9 ?3.4 3 ?10 Dig II - Wrist 13  L Ulnar - Orthodromic, (Dig V, Mid palm)     Dig V Wrist 2.6 ?3.1 6 ?5 Dig V - Wrist  11  R Ulnar - Orthodromic, (Dig V, Mid palm)     Dig V Wrist 2.7 ?3.1 6 ?5 Dig V - Wrist 52              F  Wave    Nerve F Lat Ref.   ms ms  L Ulnar - ADM 24.7 ?32.0  R Ulnar - ADM 24.9 ?32.0         EMG full       EMG Summary Table    Spontaneous MUAP Recruitment  Muscle IA Fib PSW Fasc Other Amp Dur. Poly Pattern  R. Abductor pollicis brevis Normal None None None _______ Normal Normal Normal Reduced  R. Pronator teres Normal None None None _______ Normal Normal Normal Normal  R. Deltoid Normal None None None _______ Normal Normal Normal Normal  R. Biceps brachii Normal None None None _______ Normal Normal Normal Normal  R. Extensor digitorum communis Normal None None None _______ Normal Normal Normal Normal  L. Abductor pollicis brevis Normal None None None _______ Normal Normal Normal Reduced

## 2018-04-10 ENCOUNTER — Other Ambulatory Visit: Payer: Self-pay | Admitting: Internal Medicine

## 2018-04-10 DIAGNOSIS — Z1231 Encounter for screening mammogram for malignant neoplasm of breast: Secondary | ICD-10-CM

## 2018-05-01 ENCOUNTER — Ambulatory Visit
Admission: RE | Admit: 2018-05-01 | Discharge: 2018-05-01 | Disposition: A | Discharge status: Home / Self Care | Payer: Medicare Other | Source: Ambulatory Visit | Attending: Internal Medicine | Admitting: Internal Medicine

## 2018-05-01 DIAGNOSIS — Z1231 Encounter for screening mammogram for malignant neoplasm of breast: Secondary | ICD-10-CM

## 2018-08-26 DIAGNOSIS — G250 Essential tremor: Secondary | ICD-10-CM | POA: Insufficient documentation

## 2019-01-06 DIAGNOSIS — E669 Obesity, unspecified: Secondary | ICD-10-CM | POA: Insufficient documentation

## 2019-01-06 DIAGNOSIS — E66812 Obesity, class 2: Secondary | ICD-10-CM | POA: Insufficient documentation

## 2019-04-19 ENCOUNTER — Other Ambulatory Visit: Payer: Self-pay | Admitting: Internal Medicine

## 2019-04-19 DIAGNOSIS — Z1231 Encounter for screening mammogram for malignant neoplasm of breast: Secondary | ICD-10-CM

## 2019-06-03 ENCOUNTER — Ambulatory Visit
Admission: RE | Admit: 2019-06-03 | Discharge: 2019-06-03 | Disposition: A | Discharge status: Home / Self Care | Payer: Medicare Other | Source: Ambulatory Visit | Attending: Internal Medicine | Admitting: Internal Medicine

## 2019-06-03 ENCOUNTER — Other Ambulatory Visit: Payer: Self-pay

## 2019-06-03 DIAGNOSIS — Z1231 Encounter for screening mammogram for malignant neoplasm of breast: Secondary | ICD-10-CM

## 2019-11-24 ENCOUNTER — Other Ambulatory Visit: Payer: Self-pay | Admitting: Internal Medicine

## 2019-11-24 DIAGNOSIS — R202 Paresthesia of skin: Secondary | ICD-10-CM

## 2019-12-01 ENCOUNTER — Other Ambulatory Visit: Payer: Self-pay

## 2019-12-01 ENCOUNTER — Ambulatory Visit
Admission: RE | Admit: 2019-12-01 | Discharge: 2019-12-01 | Disposition: A | Discharge status: Home / Self Care | Payer: Medicare PPO | Source: Ambulatory Visit | Attending: Internal Medicine | Admitting: Internal Medicine

## 2019-12-01 DIAGNOSIS — R202 Paresthesia of skin: Secondary | ICD-10-CM

## 2019-12-07 DIAGNOSIS — M4802 Spinal stenosis, cervical region: Secondary | ICD-10-CM | POA: Insufficient documentation

## 2019-12-08 ENCOUNTER — Ambulatory Visit: Payer: Medicare Other

## 2019-12-17 ENCOUNTER — Ambulatory Visit: Payer: Medicare PPO | Attending: Internal Medicine

## 2019-12-17 DIAGNOSIS — Z23 Encounter for immunization: Secondary | ICD-10-CM

## 2019-12-17 NOTE — Progress Notes (Signed)
Covid-19 Vaccination Clinic  Name:  Cindy Lester    MRN: [id number] DOB: 06-Jan-1946  12/17/2019  Cindy Lester was observed post Covid-19 immunization for 15 minutes without incidence. She was provided with Vaccine Information Sheet and instruction to access the V-Safe system.   Cindy Lester was instructed to call 911 with any severe reactions post vaccine: Marland Kitchen Difficulty breathing  . Swelling of your face and throat  . A fast heartbeat  . A bad rash all over your body  . Dizziness and weakness    Immunizations Administered    Name Date Dose VIS Date Route   Pfizer COVID-19 Vaccine 12/17/2019  8:38 AM 0.3 mL 10/22/2019 Intramuscular   Manufacturer: [employer], [employer]   Lot: [id number]   NDC: [address]-3

## 2019-12-21 ENCOUNTER — Ambulatory Visit: Payer: Medicare PPO | Attending: Neurosurgery | Admitting: Physical Therapy

## 2019-12-21 ENCOUNTER — Other Ambulatory Visit: Payer: Self-pay

## 2019-12-21 ENCOUNTER — Encounter: Payer: Self-pay | Admitting: Physical Therapy

## 2019-12-21 DIAGNOSIS — R252 Cramp and spasm: Secondary | ICD-10-CM | POA: Diagnosis present

## 2019-12-21 DIAGNOSIS — M5412 Radiculopathy, cervical region: Secondary | ICD-10-CM | POA: Diagnosis present

## 2019-12-21 DIAGNOSIS — M542 Cervicalgia: Secondary | ICD-10-CM | POA: Diagnosis present

## 2019-12-21 NOTE — Patient Instructions (Signed)

## 2019-12-21 NOTE — Therapy (Signed)
[address] [place], [place], [address] Phone: [phone]   Fax:  [phone]  Physical Therapy Evaluation  Patient Details  Name: Cindy Lester MRN: [id number] Date of Birth: [birth year] Referring Provider (PT): Chyrel Masson   Encounter Date: 12/21/2019  PT End of Session - 12/21/19 1001    Visit Number  1    Number of Visits  12    Date for PT Re-Evaluation  02/08/20    PT Start Time  0922    PT Stop Time  1016    PT Time Calculation (min)  54 min    Activity Tolerance  Patient tolerated treatment well    Behavior During Therapy  [facility] for tasks assessed/performed       Past Medical History:  Diagnosis Date  . Colon polyps   . Diabetes mellitus   . Diverticulosis   . GERD (gastroesophageal reflux disease)    occ-no meds  . Hyperlipidemia   . Hypertension   . Neuropathy   . Osteoarthritis   . Tingling   . Wears glasses     Past Surgical History:  Procedure Laterality Date  . ABDOMINAL HYSTERECTOMY  1987  . COLONOSCOPY    . GANGLION CYST EXCISION Right 03/17/2013   Procedure: EXCISION MASS RIGHT WRIST;  Surgeon: Wynonia Sours, MD;  Location: [place];  Service: Orthopedics;  Laterality: Right;  . HAND SURGERY Right    right-as child    There were no vitals filed for this visit.   Subjective Assessment - 12/21/19 0924    Subjective  Patient reports that she has been having neck pain for about 3-4 years, reports that she has been having numness in the hands,  She reports that she ha been having more and more pain in the left shoulder and upper arm, MRI showed spondylosis with stenosis at multi levels with some central canal compression    Pertinent History  IDDM    Limitations  House hold activities    Patient Stated Goals  have less pain, less numbness    Currently in Pain?  Yes    Pain Score  1     Pain Location  Shoulder    Pain Orientation  Left    Pain Descriptors /  Indicators  Tingling;Numbness;Sore    Pain Type  Acute pain    Pain Radiating Towards  numbness in both hands, pain and numbness/tingling in the left shoulder and upper arm    Pain Onset  More than a month ago    Pain Frequency  Constant    Aggravating Factors   sleeping and driving, mostly at night up to 9/10    Pain Relieving Factors  reports stretching her neck helps some can be 0-1/10    Effect of Pain on Daily Activities  numbness while driving, difficulty sleeping         [facility] PT Assessment - 12/21/19 0001      Assessment   Medical Diagnosis  cervical stenosis    Referring Provider (PT)  Chyrel Masson    Onset Date/Surgical Date  11/20/19    Prior Therapy  n      Precautions   Precautions  None      Balance Screen   Has the patient fallen in the past 6 months  No    Has the patient had a decrease in activity level because of a fear of falling?   No  [address] [place], [place], [address] Phone: [phone]   Fax:  [phone]  Physical Therapy Evaluation  Patient Details  Name: Cindy Lester MRN: [id number] Date of Birth: [birth year] Referring Provider (PT): Chyrel Masson   Encounter Date: 12/21/2019  PT End of Session - 12/21/19 1001    Visit Number  1    Number of Visits  12    Date for PT Re-Evaluation  02/08/20    PT Start Time  0922    PT Stop Time  1016    PT Time Calculation (min)  54 min    Activity Tolerance  Patient tolerated treatment well    Behavior During Therapy  [facility] for tasks assessed/performed       Past Medical History:  Diagnosis Date  . Colon polyps   . Diabetes mellitus   . Diverticulosis   . GERD (gastroesophageal reflux disease)    occ-no meds  . Hyperlipidemia   . Hypertension   . Neuropathy   . Osteoarthritis   . Tingling   . Wears glasses     Past Surgical History:  Procedure Laterality Date  . ABDOMINAL HYSTERECTOMY  1987  . COLONOSCOPY    . GANGLION CYST EXCISION Right 03/17/2013   Procedure: EXCISION MASS RIGHT WRIST;  Surgeon: Wynonia Sours, MD;  Location: [place];  Service: Orthopedics;  Laterality: Right;  . HAND SURGERY Right    right-as child    There were no vitals filed for this visit.   Subjective Assessment - 12/21/19 0924    Subjective  Patient reports that she has been having neck pain for about 3-4 years, reports that she has been having numness in the hands,  She reports that she ha been having more and more pain in the left shoulder and upper arm, MRI showed spondylosis with stenosis at multi levels with some central canal compression    Pertinent History  IDDM    Limitations  House hold activities    Patient Stated Goals  have less pain, less numbness    Currently in Pain?  Yes    Pain Score  1     Pain Location  Shoulder    Pain Orientation  Left    Pain Descriptors /  Indicators  Tingling;Numbness;Sore    Pain Type  Acute pain    Pain Radiating Towards  numbness in both hands, pain and numbness/tingling in the left shoulder and upper arm    Pain Onset  More than a month ago    Pain Frequency  Constant    Aggravating Factors   sleeping and driving, mostly at night up to 9/10    Pain Relieving Factors  reports stretching her neck helps some can be 0-1/10    Effect of Pain on Daily Activities  numbness while driving, difficulty sleeping         [facility] PT Assessment - 12/21/19 0001      Assessment   Medical Diagnosis  cervical stenosis    Referring Provider (PT)  Chyrel Masson    Onset Date/Surgical Date  11/20/19    Prior Therapy  n      Precautions   Precautions  None      Balance Screen   Has the patient fallen in the past 6 months  No    Has the patient had a decrease in activity level because of a fear of falling?   No  Plan - 12/21/19 1001    Clinical Impression Statement  Patient reports that she has had neck and arm pain as well as numbness and tingling in the hands, the left arm does bother her the most.  She had an MRI that showed significant stenosis, spondylosis.  There is some cord compression.  She has limited ROM of the cervical spine.  She has significant spasms and trigger points in the upper traps.    Personal Factors and Comorbidities  Comorbidity 1    Comorbidities  DM    Stability/Clinical Decision Making   Evolving/Moderate complexity    Clinical Decision Making  Low    Rehab Potential  Good    PT Frequency  2x / week    PT Duration  8 weeks    PT Treatment/Interventions  ADLs/Self Care Home Management;Ultrasound;Traction;Moist Heat;Electrical Stimulation;Therapeutic exercise;Therapeutic activities;Patient/family education;Manual techniques;Dry needling    PT Next Visit Plan  start some scapular stabilization, try traction    Consulted and Agree with Plan of Care  Patient       Patient will benefit from skilled therapeutic intervention in order to improve the following deficits and impairments:  Pain, Improper body mechanics, Postural dysfunction, Increased muscle spasms, Decreased range of motion, Decreased strength, Impaired UE functional use, Impaired flexibility  Visit Diagnosis: Cervicalgia - Plan: PT plan of care cert/re-cert  Radiculopathy, cervical region - Plan: PT plan of care cert/re-cert  Cramp and spasm - Plan: PT plan of care cert/re-cert     Problem List Patient Active Problem List   Diagnosis Date Noted  . DIARRHEA 10/24/2008  . PERSONAL HX COLONIC POLYPS 10/24/2008  . DM 10/19/2008  . HYPERLIPIDEMIA 10/19/2008  . COLONIC POLYPS 09/16/2003  . DIVERTICULOSIS, COLON 09/16/2003    Sumner Boast., PT 12/21/2019, 10:18 AM  [address] [place], [place], [address] Phone: [phone]   Fax:  [phone]  Name: Cindy Lester MRN: [id number] Date of Birth: 1946-05-17

## 2019-12-23 ENCOUNTER — Encounter: Payer: Self-pay | Admitting: Physical Therapy

## 2019-12-23 ENCOUNTER — Ambulatory Visit: Payer: Medicare PPO | Admitting: Physical Therapy

## 2019-12-23 ENCOUNTER — Other Ambulatory Visit: Payer: Self-pay

## 2019-12-23 DIAGNOSIS — R252 Cramp and spasm: Secondary | ICD-10-CM

## 2019-12-23 DIAGNOSIS — M542 Cervicalgia: Secondary | ICD-10-CM | POA: Diagnosis not present

## 2019-12-23 DIAGNOSIS — M5412 Radiculopathy, cervical region: Secondary | ICD-10-CM

## 2019-12-23 NOTE — Therapy (Signed)
TERM GOAL #1   Title  indepednent with initial HEP    Time  2    Period  Weeks    Status  New        PT Long Term Goals - 12/21/19 1014      PT LONG TERM GOAL #1   Title  understand posture and body mechanics    Time  8    Period  Weeks    Status  New      PT LONG TERM GOAL #2   Title  increase cervical ROM 25%    Time  8    Period  Weeks    Status  New      PT LONG TERM GOAL #3   Title  increase shoulder strength to 4/5    Time  8    Period  Weeks    Status  New      PT LONG TERM GOAL #4   Title  reports 25% less instances of numbness and tingling    Time  8    Period  Weeks    Status  New      PT LONG TERM GOAL #5   Title  report 25% sleep better    Time  8    Period  Weeks    Status  New            Plan - 12/23/19 0910    Clinical Impression Statement  Patient had some discomfort with the traction, she did not like the pressure on the mastoid bones, we may have to try manual traction/occipital release.  Tolerated the exercises well    PT Next Visit Plan  see how the traction did    Consulted and Agree with Plan of Care  Patient       Patient will benefit from skilled therapeutic intervention in order to improve the following deficits and impairments:  Pain,  Improper body mechanics, Postural dysfunction, Increased muscle spasms, Decreased range of motion, Decreased strength, Impaired UE functional use, Impaired flexibility  Visit Diagnosis: Cervicalgia  Radiculopathy, cervical region  Cramp and spasm     Problem List Patient Active Problem List   Diagnosis Date Noted  . DIARRHEA 10/24/2008  . PERSONAL HX COLONIC POLYPS 10/24/2008  . DM 10/19/2008  . HYPERLIPIDEMIA 10/19/2008  . COLONIC POLYPS 09/16/2003  . DIVERTICULOSIS, COLON 09/16/2003    Sumner Boast., PT 12/23/2019, 9:13 AM  [address] [place], [place], [address] Phone: [phone]   Fax:  [phone]  Name: Cindy Lester MRN: [id number] Date of Birth: December 19, 1945  [address] [place], [place], [address] Phone: [phone]   Fax:  [phone]  Physical Therapy Treatment  Patient Details  Name: Cindy Lester MRN: [id number] Date of Birth: 1946-11-08 Referring Provider (PT): Chyrel Masson   Encounter Date: 12/23/2019  PT End of Session - 12/23/19 0910    Visit Number  2    Number of Visits  12    Date for PT Re-Evaluation  02/08/20    PT Start Time  0845    PT Stop Time  0933    PT Time Calculation (min)  48 min    Activity Tolerance  Patient tolerated treatment well    Behavior During Therapy  [facility] for tasks assessed/performed       Past Medical History:  Diagnosis Date  . Colon polyps   . Diabetes mellitus   . Diverticulosis   . GERD (gastroesophageal reflux disease)    occ-no meds  . Hyperlipidemia   . Hypertension   . Neuropathy   . Osteoarthritis   . Tingling   . Wears glasses     Past Surgical History:  Procedure Laterality Date  . ABDOMINAL HYSTERECTOMY  1987  . COLONOSCOPY    . GANGLION CYST EXCISION Right 03/17/2013   Procedure: EXCISION MASS RIGHT WRIST;  Surgeon: Wynonia Sours, MD;  Location: [facility];  Service: Orthopedics;  Laterality: Right;  . HAND SURGERY Right    right-as child    There were no vitals filed for this visit.  Subjective Assessment - 12/23/19 0848    Subjective  Patient reports that the estim felt good.  She reports numbness in the left hand today    Currently in Pain?  Yes    Pain Score  2     Pain Location  Hand    Pain Orientation  Left    Pain Descriptors / Indicators  Numbness                       [facility] Adult PT Treatment/Exercise - 12/23/19 0001      Exercises   Exercises  Neck      Neck Exercises: Machines for Strengthening   UBE (Upper Arm Bike)  level 2 x 4 minutes change direction every mintue      Neck Exercises: Theraband   Shoulder Extension  20 reps;Red    Rows   20 reps;Red    Shoulder External Rotation  20 reps;Red      Neck Exercises: Supine   Neck Retraction  20 reps;3 secs    Neck Retraction Limitations  head on physio ball      Modalities   Modalities  Traction      Moist Heat Therapy   Number Minutes Moist Heat  12 Minutes    Moist Heat Location  Cervical      Electrical Stimulation   Electrical Stimulation Location  cervical    Electrical Stimulation Action  IFC    Electrical Stimulation Parameters  supine    Electrical Stimulation Goals  Pain      Traction   Type of Traction  Cervical    Min (lbs)  10    Max (lbs)  14    Hold Time  static    Time  12               PT Short Term Goals - 12/21/19 1008      PT SHORT

## 2019-12-28 ENCOUNTER — Other Ambulatory Visit: Payer: Self-pay

## 2019-12-28 ENCOUNTER — Ambulatory Visit: Payer: Medicare PPO | Admitting: Physical Therapy

## 2019-12-28 DIAGNOSIS — R252 Cramp and spasm: Secondary | ICD-10-CM

## 2019-12-28 DIAGNOSIS — M5412 Radiculopathy, cervical region: Secondary | ICD-10-CM

## 2019-12-28 DIAGNOSIS — M542 Cervicalgia: Secondary | ICD-10-CM | POA: Diagnosis not present

## 2019-12-28 NOTE — Therapy (Signed)
[facility] [address] [place], [place], [address] Phone: [phone]   Fax:  [phone]  Physical Therapy Treatment  Patient Details  Name: Cindy Lester MRN: [id number] Date of Birth: [birth year] Referring Provider (PT): Donnita Falls   Encounter Date: 12/28/2019  PT End of Session - 12/28/19 0952    Visit Number  3    Number of Visits  12    Date for PT Re-Evaluation  02/08/20    PT Start Time  0930    PT Stop Time  1020    PT Time Calculation (min)  50 min       Past Medical History:  Diagnosis Date  . Colon polyps   . Diabetes mellitus   . Diverticulosis   . GERD (gastroesophageal reflux disease)    occ-no meds  . Hyperlipidemia   . Hypertension   . Neuropathy   . Osteoarthritis   . Tingling   . Wears glasses     Past Surgical History:  Procedure Laterality Date  . ABDOMINAL HYSTERECTOMY  1987  . COLONOSCOPY    . GANGLION CYST EXCISION Right 03/17/2013   Procedure: EXCISION MASS RIGHT WRIST;  Surgeon: Nicki Reaper, MD;  Location: [facility];  Service: Orthopedics;  Laterality: Right;  . HAND SURGERY Right    right-as child    There were no vitals filed for this visit.  Subjective Assessment - 12/28/19 0932    Subjective  traction helped for 3 nights arm did not go numb    Currently in Pain?  Yes    Pain Score  1     Pain Location  Hand    Pain Orientation  Left                       [facility] Adult PT Treatment/Exercise - 12/28/19 0001      Neck Exercises: Machines for Strengthening   UBE (Upper Arm Bike)  L 3 3 fwd/3back    Cybex Row  2 sets 10 20#    Lat Pull  2 sets 10 20#      Neck Exercises: Standing   Neck Retraction  10 reps;3 secs   head on ball   Other Standing Exercises  5# shruggs, backward rolls and shld ext   10 each   Other Standing Exercises  cerv self stretcthing with 5# handwts to increase stretch      Modalities   Modalities  Traction       Moist Heat Therapy   Number Minutes Moist Heat  15 Minutes    Moist Heat Location  Cervical      Electrical Stimulation   Electrical Stimulation Location  cervical    Electrical Stimulation Action  IFC    Electrical Stimulation Parameters  supine    Electrical Stimulation Goals  Pain      Traction   Type of Traction  Cervical    Max (lbs)  14    Hold Time  static    Time  12               PT Short Term Goals - 12/21/19 1008      PT SHORT TERM GOAL #1   Title  indepednent with initial HEP    Time  2    Period  Weeks    Status  New        PT Long Term Goals - 12/21/19 1014  PT LONG TERM GOAL #1   Title  understand posture and body mechanics    Time  8    Period  Weeks    Status  New      PT LONG TERM GOAL #2   Title  increase cervical ROM 25%    Time  8    Period  Weeks    Status  New      PT LONG TERM GOAL #3   Title  increase shoulder strength to 4/5    Time  8    Period  Weeks    Status  New      PT LONG TERM GOAL #4   Title  reports 25% less instances of numbness and tingling    Time  8    Period  Weeks    Status  New      PT LONG TERM GOAL #5   Title  report 25% sleep better    Time  8    Period  Weeks    Status  New            Plan - 12/28/19 4010    Clinical Impression Statement  pt had good relief last tx with modalities and 3 nights without numbness so continued with modalities and progressed postural ex with some cuing needed to deactivate trap with ex    PT Treatment/Interventions  ADLs/Self Care Home Management;Ultrasound;Traction;Moist Heat;Electrical Stimulation;Therapeutic exercise;Therapeutic activities;Patient/family education;Manual techniques;Dry needling    PT Next Visit Plan  progress ex and continue with modalities       Patient will benefit from skilled therapeutic intervention in order to improve the following deficits and impairments:  Pain, Improper body mechanics, Postural dysfunction, Increased  muscle spasms, Decreased range of motion, Decreased strength, Impaired UE functional use, Impaired flexibility  Visit Diagnosis: Cramp and spasm  Radiculopathy, cervical region  Cervicalgia     Problem List Patient Active Problem List   Diagnosis Date Noted  . DIARRHEA 10/24/2008  . PERSONAL HX COLONIC POLYPS 10/24/2008  . DM 10/19/2008  . HYPERLIPIDEMIA 10/19/2008  . COLONIC POLYPS 09/16/2003  . DIVERTICULOSIS, COLON 09/16/2003    Tabathia Knoche,ANGIE PTA 12/28/2019, 9:56 AM  [facility] [address] [place], [place], [address] Phone: [phone]   Fax:  [phone]  Name: SUNNYE SAARINEN MRN: [id number] Date of Birth: 1946/10/24

## 2019-12-29 ENCOUNTER — Ambulatory Visit: Payer: Medicare Other

## 2019-12-30 ENCOUNTER — Ambulatory Visit: Payer: Medicare PPO | Admitting: Physical Therapy

## 2020-01-04 ENCOUNTER — Ambulatory Visit: Payer: Medicare PPO | Admitting: Physical Therapy

## 2020-01-04 ENCOUNTER — Encounter: Payer: Self-pay | Admitting: Physical Therapy

## 2020-01-04 ENCOUNTER — Other Ambulatory Visit: Payer: Self-pay

## 2020-01-04 DIAGNOSIS — M542 Cervicalgia: Secondary | ICD-10-CM | POA: Diagnosis not present

## 2020-01-04 DIAGNOSIS — R252 Cramp and spasm: Secondary | ICD-10-CM

## 2020-01-04 DIAGNOSIS — M5412 Radiculopathy, cervical region: Secondary | ICD-10-CM

## 2020-01-04 NOTE — Therapy (Signed)
[facility] [address] [place], [place], [address] Phone: [phone]   Fax:  [phone]  Physical Therapy Treatment  Patient Details  Name: Cindy Lester MRN: [id number] Date of Birth: Nov 04, 1946 Referring Provider (PT): Donnita Falls   Encounter Date: 01/04/2020  PT End of Session - 01/04/20 1010    Visit Number  4    Number of Visits  12    Date for PT Re-Evaluation  02/08/20    PT Start Time  0925    PT Stop Time  1025    PT Time Calculation (min)  60 min    Activity Tolerance  Patient tolerated treatment well    Behavior During Therapy  [facility] for tasks assessed/performed       Past Medical History:  Diagnosis Date  . Colon polyps   . Diabetes mellitus   . Diverticulosis   . GERD (gastroesophageal reflux disease)    occ-no meds  . Hyperlipidemia   . Hypertension   . Neuropathy   . Osteoarthritis   . Tingling   . Wears glasses     Past Surgical History:  Procedure Laterality Date  . ABDOMINAL HYSTERECTOMY  1987  . COLONOSCOPY    . GANGLION CYST EXCISION Right 03/17/2013   Procedure: EXCISION MASS RIGHT WRIST;  Surgeon: Nicki Reaper, MD;  Location: [facility];  Service: Orthopedics;  Laterality: Right;  . HAND SURGERY Right    right-as child    There were no vitals filed for this visit.  Subjective Assessment - 01/04/20 0928    Subjective  Patient reports that she did not get as much relief after the last traction, still c/o numbness in the hands, left arm is alos having some issues with "sleep"    Currently in Pain?  Yes    Pain Score  2     Pain Location  Hand    Pain Orientation  Left    Aggravating Factors   worse at night                       [facility] Adult PT Treatment/Exercise - 01/04/20 0001      Neck Exercises: Machines for Strengthening   UBE (Upper Arm Bike)  L 3 3 fwd/3back    Cybex Row  2 sets 10 20#    Lat Pull  2 sets 10 20#      Neck Exercises:  Theraband   Shoulder External Rotation  20 reps;Red      Neck Exercises: Standing   Other Standing Exercises  5# shrugs with upper trap and levator stretch      Moist Heat Therapy   Number Minutes Moist Heat  10 Minutes    Moist Heat Location  Cervical      Electrical Stimulation   Electrical Stimulation Location  cervical    Electrical Stimulation Action  IFC    Electrical Stimulation Parameters  supine    Electrical Stimulation Goals  Pain      Traction   Type of Traction  Cervical    Max (lbs)  14    Hold Time  static    Time  12      Manual Therapy   Manual Therapy  Soft tissue mobilization;Passive ROM    Soft tissue mobilization  to the left upper trap, rhomboid and cervical area    Passive ROM  PROM of the cervical spine with shoulder depression, some neural  tension stretche3s of the left UE               PT Short Term Goals - 12/21/19 1008      PT SHORT TERM GOAL #1   Title  indepednent with initial HEP    Time  2    Period  Weeks    Status  New        PT Long Term Goals - 12/21/19 1014      PT LONG TERM GOAL #1   Title  understand posture and body mechanics    Time  8    Period  Weeks    Status  New      PT LONG TERM GOAL #2   Title  increase cervical ROM 25%    Time  8    Period  Weeks    Status  New      PT LONG TERM GOAL #3   Title  increase shoulder strength to 4/5    Time  8    Period  Weeks    Status  New      PT LONG TERM GOAL #4   Title  reports 25% less instances of numbness and tingling    Time  8    Period  Weeks    Status  New      PT LONG TERM GOAL #5   Title  report 25% sleep better    Time  8    Period  Weeks    Status  New            Plan - 01/04/20 1010    Clinical Impression Statement  Patient is very tight, I changed some things up and did a lot more manual to see if we could affect the significant spasms that she has in the left upper trap and the neck area, she tolerated well, had some neural tension  in the left UE.    PT Next Visit Plan  see how she responds to todays treatment    Consulted and Agree with Plan of Care  Patient       Patient will benefit from skilled therapeutic intervention in order to improve the following deficits and impairments:  Pain, Improper body mechanics, Postural dysfunction, Increased muscle spasms, Decreased range of motion, Decreased strength, Impaired UE functional use, Impaired flexibility  Visit Diagnosis: Cramp and spasm  Radiculopathy, cervical region  Cervicalgia     Problem List Patient Active Problem List   Diagnosis Date Noted  . DIARRHEA 10/24/2008  . PERSONAL HX COLONIC POLYPS 10/24/2008  . DM 10/19/2008  . HYPERLIPIDEMIA 10/19/2008  . COLONIC POLYPS 09/16/2003  . DIVERTICULOSIS, COLON 09/16/2003    Jearld Lesch., PT 01/04/2020, 10:12 AM  [facility] [address] [facility] [address] [place], [place], [address] Phone: [phone]   Fax:  [phone]  Name: Cindy Lester MRN: [id number] Date of Birth: [birth year]

## 2020-01-06 ENCOUNTER — Encounter: Payer: Self-pay | Admitting: Physical Therapy

## 2020-01-06 ENCOUNTER — Ambulatory Visit: Payer: Medicare PPO | Admitting: Physical Therapy

## 2020-01-06 ENCOUNTER — Other Ambulatory Visit: Payer: Self-pay

## 2020-01-06 DIAGNOSIS — R252 Cramp and spasm: Secondary | ICD-10-CM

## 2020-01-06 DIAGNOSIS — M5412 Radiculopathy, cervical region: Secondary | ICD-10-CM

## 2020-01-06 DIAGNOSIS — M542 Cervicalgia: Secondary | ICD-10-CM | POA: Diagnosis not present

## 2020-01-06 NOTE — Therapy (Signed)
PT Short Term Goals - 12/21/19 1008      PT SHORT TERM GOAL #1   Title  indepednent with initial HEP    Time  2    Period  Weeks    Status  New        PT Long Term Goals - 01/06/20 1006      PT LONG TERM GOAL #1   Title  understand posture and body mechanics    Status  Partially Met      PT LONG TERM GOAL #2   Title  increase cervical ROM 25%    Status  Partially Met      PT LONG TERM GOAL #3   Title  increase shoulder strength to 4/5    Status  On-going      PT LONG TERM GOAL #4   Title  reports 25% less instances of numbness and tingling    Status  On-going            Plan - 01/06/20 1004    Clinical Impression Statement  I instructed patinet in some pillow posistioning to allow the arm to be in a neutral position with sleep on side and on back,  She was still sore from the STM the other day on the left upper trap.  Has difficulty with posture and reports that she is on the computer a lot.  Gave ideas of how to check posture and move every 30 minutes while on computer    PT Next Visit Plan  could try DN if needed    Consulted and Agree with Plan of Care  Patient       Patient will  benefit from skilled therapeutic intervention in order to improve the following deficits and impairments:  Pain, Improper body mechanics, Postural dysfunction, Increased muscle spasms, Decreased range of motion, Decreased strength, Impaired UE functional use, Impaired flexibility  Visit Diagnosis: Cramp and spasm  Radiculopathy, cervical region  Cervicalgia     Problem List Patient Active Problem List   Diagnosis Date Noted  . DIARRHEA 10/24/2008  . PERSONAL HX COLONIC POLYPS 10/24/2008  . DM 10/19/2008  . HYPERLIPIDEMIA 10/19/2008  . COLONIC POLYPS 09/16/2003  . DIVERTICULOSIS, COLON 09/16/2003    Sumner Boast., PT 01/06/2020, 10:06 AM  [address] [place], [place], [address] Phone: [phone]   Fax:  [phone]  Name: Cindy Lester Date of Birth: Jan 20, 1946  [place], [place], [address] Phone: [phone]   Fax:  [phone]  Physical Therapy Treatment  Patient Details  Name: Cindy Lester MRN: [id number] Date of Birth: 1946/10/02 Referring Provider (PT): Chyrel Masson   Encounter Date: 01/06/2020  PT End of Session - 01/06/20 1004    Visit Number  5    Number of Visits  12    Date for PT Re-Evaluation  02/08/20    PT Start Time  0930    PT Stop Time  1027    PT Time Calculation (min)  57 min    Activity Tolerance  Patient tolerated treatment well    Behavior During Therapy  [facility] for tasks assessed/performed       Past Medical History:  Diagnosis Date  . Colon polyps   . Diabetes mellitus   . Diverticulosis   . GERD (gastroesophageal reflux disease)    occ-no meds  . Hyperlipidemia   . Hypertension   . Neuropathy   . Osteoarthritis   . Tingling   . Wears glasses     Past Surgical History:  Procedure Laterality Date  . ABDOMINAL HYSTERECTOMY  1987  . COLONOSCOPY    . GANGLION CYST EXCISION Right 03/17/2013   Procedure: EXCISION MASS RIGHT WRIST;  Surgeon: Wynonia Sours, MD;  Location: [place];  Service: Orthopedics;  Laterality: Right;  . HAND SURGERY Right    right-as child    There were no vitals filed for this visit.  Subjective Assessment - 01/06/20 0935    Subjective  Felt like the STM helped, less pain and numbness    Currently in Pain?  Yes    Pain Score  1     Pain Location  Hand    Pain Orientation  Left    Pain Descriptors / Indicators  Tingling    Aggravating Factors   sleeping                       [facility] Adult PT Treatment/Exercise - 01/06/20 0001      Neck Exercises: Machines for Strengthening   Cybex Row  2 sets 10 20#    Lat Pull  2 sets 10 20#      Neck Exercises: Theraband   Shoulder External Rotation  20 reps;Red      Neck Exercises: Standing   Other Standing Exercises   5# shrugs with upper trap and levator stretch    Other Standing Exercises  weighted ball overhead lift      Moist Heat Therapy   Number Minutes Moist Heat  10 Minutes    Moist Heat Location  Cervical      Electrical Stimulation   Electrical Stimulation Location  cervical    Electrical Stimulation Action  IFC    Electrical Stimulation Parameters  supine    Electrical Stimulation Goals  Pain      Traction   Type of Traction  Cervical    Max (lbs)  15    Hold Time  static    Time  10      Manual Therapy   Manual Therapy  Soft tissue mobilization;Passive ROM    Soft tissue mobilization  to the left upper trap, rhomboid and cervical area    Passive ROM  PROM of the cervical spine with shoulder depression, some neural tension stretche3s of the left UE

## 2020-01-10 ENCOUNTER — Other Ambulatory Visit: Payer: Self-pay

## 2020-01-10 ENCOUNTER — Encounter: Payer: Self-pay | Admitting: Physical Therapy

## 2020-01-10 ENCOUNTER — Ambulatory Visit: Payer: Medicare PPO | Attending: Neurosurgery | Admitting: Physical Therapy

## 2020-01-10 DIAGNOSIS — M5412 Radiculopathy, cervical region: Secondary | ICD-10-CM | POA: Insufficient documentation

## 2020-01-10 DIAGNOSIS — R252 Cramp and spasm: Secondary | ICD-10-CM | POA: Insufficient documentation

## 2020-01-10 DIAGNOSIS — M542 Cervicalgia: Secondary | ICD-10-CM | POA: Diagnosis present

## 2020-01-10 NOTE — Therapy (Signed)
[facility] [place] [address] [place], [place], [address] Phone: [phone]   Fax:  [phone]  Physical Therapy Treatment  Patient Details  Name: Cindy Lester MRN: [id number] Date of Birth: 03/19/1946 Referring Provider (PT): Donnita Falls   Encounter Date: 01/10/2020  PT End of Session - 01/10/20 1011    Visit Number  6    Number of Visits  12    Date for PT Re-Evaluation  02/08/20    PT Start Time  0930    PT Stop Time  1028    PT Time Calculation (min)  58 min    Activity Tolerance  Patient tolerated treatment well    Behavior During Therapy  [facility] for tasks assessed/performed       Past Medical History:  Diagnosis Date  . Colon polyps   . Diabetes mellitus   . Diverticulosis   . GERD (gastroesophageal reflux disease)    occ-no meds  . Hyperlipidemia   . Hypertension   . Neuropathy   . Osteoarthritis   . Tingling   . Wears glasses     Past Surgical History:  Procedure Laterality Date  . ABDOMINAL HYSTERECTOMY  1987  . COLONOSCOPY    . GANGLION CYST EXCISION Right 03/17/2013   Procedure: EXCISION MASS RIGHT WRIST;  Surgeon: Nicki Reaper, MD;  Location: [facility];  Service: Orthopedics;  Laterality: Right;  . HAND SURGERY Right    right-as child    There were no vitals filed for this visit.  Subjective Assessment - 01/10/20 0940    Subjective  Reports less numbness recently, feeling better    Currently in Pain?  Yes    Pain Score  2     Pain Location  Neck    Pain Orientation  Left    Aggravating Factors   computer work                       [facility] Adult PT Treatment/Exercise - 01/10/20 0001      Neck Exercises: Machines for Strengthening   UBE (Upper Arm Bike)  L 3 3 fwd/3back    Cybex Row  2 sets 10 20#    Lat Pull  2 sets 10 20#      Neck Exercises: Theraband   Shoulder Extension  20 reps;Red    Shoulder External Rotation  20 reps;Red      Neck Exercises:  Standing   Neck Retraction  10 reps;3 secs    Other Standing Exercises  5# shrugs with upper trap and levator stretch    Other Standing Exercises  weighted ball overhead lift      Neck Exercises: Seated   W Back  15 reps      Moist Heat Therapy   Number Minutes Moist Heat  10 Minutes    Moist Heat Location  Cervical      Electrical Stimulation   Electrical Stimulation Location  cervical    Electrical Stimulation Action  IFC    Electrical Stimulation Parameters  supine    Electrical Stimulation Goals  Pain      Traction   Type of Traction  Cervical    Max (lbs)  15    Hold Time  static    Time  10      Manual Therapy   Manual Therapy  Soft tissue mobilization;Passive ROM    Soft tissue mobilization  to the left upper trap, rhomboid  and cervical area    Passive ROM  PROM of the cervical spine with shoulder depression, some neural tension stretche3s of the left UE               PT Short Term Goals - 12/21/19 1008      PT SHORT TERM GOAL #1   Title  indepednent with initial HEP    Time  2    Period  Weeks    Status  New        PT Long Term Goals - 01/06/20 1006      PT LONG TERM GOAL #1   Title  understand posture and body mechanics    Status  Partially Met      PT LONG TERM GOAL #2   Title  increase cervical ROM 25%    Status  Partially Met      PT LONG TERM GOAL #3   Title  increase shoulder strength to 4/5    Status  On-going      PT LONG TERM GOAL #4   Title  reports 25% less instances of numbness and tingling    Status  On-going            Plan - 01/10/20 1011    Clinical Impression Statement  Patient is having less tightness and reports feels like she has increased motions, still significant knots in the left upper trap, a lot of cues still needed for posture and form    PT Next Visit Plan  continue with current plan    Consulted and Agree with Plan of Care  Patient       Patient will benefit from skilled therapeutic intervention in  order to improve the following deficits and impairments:  Pain, Improper body mechanics, Postural dysfunction, Increased muscle spasms, Decreased range of motion, Decreased strength, Impaired UE functional use, Impaired flexibility  Visit Diagnosis: Cramp and spasm  Radiculopathy, cervical region  Cervicalgia     Problem List Patient Active Problem List   Diagnosis Date Noted  . DIARRHEA 10/24/2008  . PERSONAL HX COLONIC POLYPS 10/24/2008  . DM 10/19/2008  . HYPERLIPIDEMIA 10/19/2008  . COLONIC POLYPS 09/16/2003  . DIVERTICULOSIS, COLON 09/16/2003    Jearld Lesch., PT 01/10/2020, 10:16 AM  [facility]- [address] [place], [place], [address] Phone: [phone]   Fax:  [phone]  Name: Cindy Lester MRN: [id number] Date of Birth: 1946/04/03

## 2020-01-11 ENCOUNTER — Ambulatory Visit: Payer: Medicare PPO | Attending: Internal Medicine

## 2020-01-11 DIAGNOSIS — Z23 Encounter for immunization: Secondary | ICD-10-CM | POA: Insufficient documentation

## 2020-01-11 NOTE — Progress Notes (Signed)
Covid-19 Vaccination Clinic  Name:  AYN CARRITHERS    MRN: [id number] DOB: 09/28/1946  01/11/2020  Ms. Hawkey was observed post Covid-19 immunization for 15 minutes without incident. She was provided with Vaccine Information Sheet and instruction to access the V-Safe system.   Ms. Paustian was instructed to call 911 with any severe reactions post vaccine: Marland Kitchen Difficulty breathing  . Swelling of face and throat  . A fast heartbeat  . A bad rash all over body  . Dizziness and weakness   Immunizations Administered    Name Date Dose VIS Date Route   Pfizer COVID-19 Vaccine 01/11/2020  9:25 AM 0.3 mL 10/22/2019 Intramuscular   Manufacturer: [employer], [employer]   Lot: [id number]   NDC: [address]-2

## 2020-01-13 ENCOUNTER — Ambulatory Visit: Payer: Medicare PPO | Admitting: Physical Therapy

## 2020-01-13 ENCOUNTER — Other Ambulatory Visit: Payer: Self-pay

## 2020-01-13 DIAGNOSIS — M5412 Radiculopathy, cervical region: Secondary | ICD-10-CM

## 2020-01-13 DIAGNOSIS — R252 Cramp and spasm: Secondary | ICD-10-CM

## 2020-01-13 DIAGNOSIS — M542 Cervicalgia: Secondary | ICD-10-CM

## 2020-01-13 NOTE — Therapy (Signed)
[address] [place], [place], [address] Phone: [phone]   Fax:  [phone]  Physical Therapy Treatment  Patient Details  Name: Cindy Lester MRN: [id number] Date of Birth: May 08, 1946 Referring Provider (PT): Chyrel Masson   Encounter Date: 01/13/2020  PT End of Session - 01/13/20 1008    Visit Number  7    Number of Visits  12    Date for PT Re-Evaluation  02/08/20    PT Start Time  0940    PT Stop Time  1030    PT Time Calculation (min)  50 min       Past Medical History:  Diagnosis Date  . Colon polyps   . Diabetes mellitus   . Diverticulosis   . GERD (gastroesophageal reflux disease)    occ-no meds  . Hyperlipidemia   . Hypertension   . Neuropathy   . Osteoarthritis   . Tingling   . Wears glasses     Past Surgical History:  Procedure Laterality Date  . ABDOMINAL HYSTERECTOMY  1987  . COLONOSCOPY    . GANGLION CYST EXCISION Right 03/17/2013   Procedure: EXCISION MASS RIGHT WRIST;  Surgeon: Wynonia Sours, MD;  Location: [place];  Service: Orthopedics;  Laterality: Right;  . HAND SURGERY Right    right-as child    There were no vitals filed for this visit.  Subjective Assessment - 01/13/20 0939    Subjective  worried about visit limit in case I have surgery. seeing MD next week. numbness back last night. knot in neck better    Currently in Pain?  Yes    Pain Score  2     Pain Location  Neck                       [facility] Adult PT Treatment/Exercise - 01/13/20 0001      Neck Exercises: Machines for Strengthening   UBE (Upper Arm Bike)  L 3 3 fwd/3back      Moist Heat Therapy   Number Minutes Moist Heat  10 Minutes    Moist Heat Location  Cervical      Electrical Stimulation   Electrical Stimulation Location  cervical    Electrical Stimulation Action  IFC    Electrical Stimulation Parameters  supine    Electrical Stimulation Goals  Pain       Traction   Type of Traction  Cervical    Max (lbs)  15    Hold Time  static    Time  10             PT Education - 01/13/20 0951    Education Details  red tband scap stab. info on home traction    Person(s) Educated  Patient    Methods  Explanation;Demonstration;Handout    Comprehension  Verbalized understanding;Returned demonstration       PT Short Term Goals - 12/21/19 1008      PT SHORT TERM GOAL #1   Title  indepednent with initial HEP    Time  2    Period  Weeks    Status  New        PT Long Term Goals - 01/13/20 1009      PT LONG TERM GOAL #1   Title  understand posture and body mechanics    Status  Achieved      PT LONG TERM GOAL #2  Title  increase cervical ROM 25%    Status  Achieved      PT LONG TERM GOAL #3   Title  increase shoulder strength to 4/5    Status  Partially Met      PT LONG TERM GOAL #4   Title  reports 25% less instances of numbness and tingling    Status  Partially Met      PT LONG TERM GOAL #5   Title  report 25% sleep better    Status  Partially Met            Plan - 01/13/20 1008    Clinical Impression Statement  focus on educ for home maintanance. progressing with goals. following up with MD next week    PT Treatment/Interventions  ADLs/Self Care Home Management;Ultrasound;Traction;Moist Heat;Electrical Stimulation;Therapeutic exercise;Therapeutic activities;Patient/family education;Manual techniques;Dry needling    PT Next Visit Plan  pt to call after MD follow up       Patient will benefit from skilled therapeutic intervention in order to improve the following deficits and impairments:  Pain, Improper body mechanics, Postural dysfunction, Increased muscle spasms, Decreased range of motion, Decreased strength, Impaired UE functional use, Impaired flexibility  Visit Diagnosis: Cramp and spasm  Radiculopathy, cervical region  Cervicalgia     Problem List Patient Active Problem List   Diagnosis Date Noted   . DIARRHEA 10/24/2008  . PERSONAL HX COLONIC POLYPS 10/24/2008  . DM 10/19/2008  . HYPERLIPIDEMIA 10/19/2008  . COLONIC POLYPS 09/16/2003  . DIVERTICULOSIS, COLON 09/16/2003    Tacey Dimaggio,ANGIE PTA 01/13/2020, 10:10 AM  [address] [place], [place], [address] Phone: [phone]   Fax:  [phone]  Name: MAIRE GOVAN MRN: [id number] Date of Birth: 1945-11-30

## 2020-01-25 ENCOUNTER — Ambulatory Visit: Payer: Medicare PPO | Admitting: Physical Therapy

## 2020-01-27 ENCOUNTER — Ambulatory Visit: Payer: Medicare PPO | Admitting: Physical Therapy

## 2020-02-01 ENCOUNTER — Ambulatory Visit: Payer: Medicare PPO | Admitting: Physical Therapy

## 2020-02-03 ENCOUNTER — Ambulatory Visit: Payer: Medicare PPO | Admitting: Physical Therapy

## 2020-02-14 DIAGNOSIS — E1129 Type 2 diabetes mellitus with other diabetic kidney complication: Secondary | ICD-10-CM | POA: Diagnosis not present

## 2020-02-14 DIAGNOSIS — I10 Essential (primary) hypertension: Secondary | ICD-10-CM | POA: Diagnosis not present

## 2020-02-14 DIAGNOSIS — Z794 Long term (current) use of insulin: Secondary | ICD-10-CM | POA: Diagnosis not present

## 2020-02-14 DIAGNOSIS — G5603 Carpal tunnel syndrome, bilateral upper limbs: Secondary | ICD-10-CM | POA: Diagnosis not present

## 2020-02-14 DIAGNOSIS — R197 Diarrhea, unspecified: Secondary | ICD-10-CM | POA: Diagnosis not present

## 2020-02-14 DIAGNOSIS — R202 Paresthesia of skin: Secondary | ICD-10-CM | POA: Diagnosis not present

## 2020-03-15 DIAGNOSIS — E1129 Type 2 diabetes mellitus with other diabetic kidney complication: Secondary | ICD-10-CM | POA: Diagnosis not present

## 2020-03-23 DIAGNOSIS — Z03818 Encounter for observation for suspected exposure to other biological agents ruled out: Secondary | ICD-10-CM | POA: Diagnosis not present

## 2020-03-23 DIAGNOSIS — Z20828 Contact with and (suspected) exposure to other viral communicable diseases: Secondary | ICD-10-CM | POA: Diagnosis not present

## 2020-04-17 DIAGNOSIS — M4712 Other spondylosis with myelopathy, cervical region: Secondary | ICD-10-CM | POA: Diagnosis not present

## 2020-04-24 ENCOUNTER — Other Ambulatory Visit: Payer: Self-pay | Admitting: Internal Medicine

## 2020-04-24 DIAGNOSIS — Z1231 Encounter for screening mammogram for malignant neoplasm of breast: Secondary | ICD-10-CM

## 2020-05-29 DIAGNOSIS — E785 Hyperlipidemia, unspecified: Secondary | ICD-10-CM | POA: Diagnosis not present

## 2020-05-29 DIAGNOSIS — R197 Diarrhea, unspecified: Secondary | ICD-10-CM | POA: Diagnosis not present

## 2020-05-29 DIAGNOSIS — I129 Hypertensive chronic kidney disease with stage 1 through stage 4 chronic kidney disease, or unspecified chronic kidney disease: Secondary | ICD-10-CM | POA: Diagnosis not present

## 2020-05-29 DIAGNOSIS — E1129 Type 2 diabetes mellitus with other diabetic kidney complication: Secondary | ICD-10-CM | POA: Diagnosis not present

## 2020-05-29 DIAGNOSIS — N1831 Chronic kidney disease, stage 3a: Secondary | ICD-10-CM | POA: Diagnosis not present

## 2020-05-29 DIAGNOSIS — R202 Paresthesia of skin: Secondary | ICD-10-CM | POA: Diagnosis not present

## 2020-05-29 DIAGNOSIS — Z01818 Encounter for other preprocedural examination: Secondary | ICD-10-CM | POA: Diagnosis not present

## 2020-06-05 ENCOUNTER — Ambulatory Visit
Admission: RE | Admit: 2020-06-05 | Discharge: 2020-06-05 | Disposition: A | Discharge status: Home / Self Care | Payer: Medicare PPO | Source: Ambulatory Visit | Attending: Internal Medicine | Admitting: Internal Medicine

## 2020-06-05 ENCOUNTER — Other Ambulatory Visit: Payer: Self-pay

## 2020-06-05 DIAGNOSIS — Z1231 Encounter for screening mammogram for malignant neoplasm of breast: Secondary | ICD-10-CM | POA: Diagnosis not present

## 2020-06-13 DIAGNOSIS — E1129 Type 2 diabetes mellitus with other diabetic kidney complication: Secondary | ICD-10-CM | POA: Diagnosis not present

## 2020-06-20 DIAGNOSIS — M2578 Osteophyte, vertebrae: Secondary | ICD-10-CM | POA: Diagnosis not present

## 2020-06-20 DIAGNOSIS — M50022 Cervical disc disorder at C5-C6 level with myelopathy: Secondary | ICD-10-CM | POA: Diagnosis not present

## 2020-06-20 DIAGNOSIS — M4802 Spinal stenosis, cervical region: Secondary | ICD-10-CM | POA: Diagnosis not present

## 2020-07-04 DIAGNOSIS — M4712 Other spondylosis with myelopathy, cervical region: Secondary | ICD-10-CM | POA: Diagnosis not present

## 2020-07-04 DIAGNOSIS — N1831 Chronic kidney disease, stage 3a: Secondary | ICD-10-CM | POA: Diagnosis not present

## 2020-07-04 DIAGNOSIS — E1129 Type 2 diabetes mellitus with other diabetic kidney complication: Secondary | ICD-10-CM | POA: Diagnosis not present

## 2020-07-04 DIAGNOSIS — Z6837 Body mass index (BMI) 37.0-37.9, adult: Secondary | ICD-10-CM | POA: Diagnosis not present

## 2020-07-04 DIAGNOSIS — Z4681 Encounter for fitting and adjustment of insulin pump: Secondary | ICD-10-CM | POA: Diagnosis not present

## 2020-07-04 DIAGNOSIS — M4802 Spinal stenosis, cervical region: Secondary | ICD-10-CM | POA: Diagnosis not present

## 2020-07-04 DIAGNOSIS — Z794 Long term (current) use of insulin: Secondary | ICD-10-CM | POA: Diagnosis not present

## 2020-07-04 DIAGNOSIS — I129 Hypertensive chronic kidney disease with stage 1 through stage 4 chronic kidney disease, or unspecified chronic kidney disease: Secondary | ICD-10-CM | POA: Diagnosis not present

## 2020-07-31 DIAGNOSIS — I10 Essential (primary) hypertension: Secondary | ICD-10-CM | POA: Diagnosis not present

## 2020-07-31 DIAGNOSIS — E1129 Type 2 diabetes mellitus with other diabetic kidney complication: Secondary | ICD-10-CM | POA: Diagnosis not present

## 2020-07-31 DIAGNOSIS — Z6838 Body mass index (BMI) 38.0-38.9, adult: Secondary | ICD-10-CM | POA: Diagnosis not present

## 2020-07-31 DIAGNOSIS — M4712 Other spondylosis with myelopathy, cervical region: Secondary | ICD-10-CM | POA: Diagnosis not present

## 2020-07-31 DIAGNOSIS — M4802 Spinal stenosis, cervical region: Secondary | ICD-10-CM | POA: Diagnosis not present

## 2020-08-07 DIAGNOSIS — N1831 Chronic kidney disease, stage 3a: Secondary | ICD-10-CM | POA: Diagnosis not present

## 2020-08-07 DIAGNOSIS — E1129 Type 2 diabetes mellitus with other diabetic kidney complication: Secondary | ICD-10-CM | POA: Diagnosis not present

## 2020-08-07 DIAGNOSIS — I129 Hypertensive chronic kidney disease with stage 1 through stage 4 chronic kidney disease, or unspecified chronic kidney disease: Secondary | ICD-10-CM | POA: Diagnosis not present

## 2020-08-07 DIAGNOSIS — Z4681 Encounter for fitting and adjustment of insulin pump: Secondary | ICD-10-CM | POA: Diagnosis not present

## 2020-08-07 DIAGNOSIS — Z794 Long term (current) use of insulin: Secondary | ICD-10-CM | POA: Diagnosis not present

## 2020-08-22 DIAGNOSIS — E1129 Type 2 diabetes mellitus with other diabetic kidney complication: Secondary | ICD-10-CM | POA: Diagnosis not present

## 2020-08-22 DIAGNOSIS — E785 Hyperlipidemia, unspecified: Secondary | ICD-10-CM | POA: Diagnosis not present

## 2020-08-30 DIAGNOSIS — Z23 Encounter for immunization: Secondary | ICD-10-CM | POA: Diagnosis not present

## 2020-08-30 DIAGNOSIS — E785 Hyperlipidemia, unspecified: Secondary | ICD-10-CM | POA: Diagnosis not present

## 2020-08-30 DIAGNOSIS — E669 Obesity, unspecified: Secondary | ICD-10-CM | POA: Diagnosis not present

## 2020-08-30 DIAGNOSIS — R197 Diarrhea, unspecified: Secondary | ICD-10-CM | POA: Diagnosis not present

## 2020-08-30 DIAGNOSIS — Z0000 Encounter for general adult medical examination without abnormal findings: Secondary | ICD-10-CM | POA: Diagnosis not present

## 2020-08-30 DIAGNOSIS — E1129 Type 2 diabetes mellitus with other diabetic kidney complication: Secondary | ICD-10-CM | POA: Diagnosis not present

## 2020-08-30 DIAGNOSIS — R82998 Other abnormal findings in urine: Secondary | ICD-10-CM | POA: Diagnosis not present

## 2020-08-30 DIAGNOSIS — N1831 Chronic kidney disease, stage 3a: Secondary | ICD-10-CM | POA: Diagnosis not present

## 2020-08-30 DIAGNOSIS — I10 Essential (primary) hypertension: Secondary | ICD-10-CM | POA: Diagnosis not present

## 2020-08-30 DIAGNOSIS — Z794 Long term (current) use of insulin: Secondary | ICD-10-CM | POA: Diagnosis not present

## 2020-08-31 DIAGNOSIS — Z1212 Encounter for screening for malignant neoplasm of rectum: Secondary | ICD-10-CM | POA: Diagnosis not present

## 2020-09-11 DIAGNOSIS — M4712 Other spondylosis with myelopathy, cervical region: Secondary | ICD-10-CM | POA: Diagnosis not present

## 2020-09-11 DIAGNOSIS — Z6838 Body mass index (BMI) 38.0-38.9, adult: Secondary | ICD-10-CM | POA: Diagnosis not present

## 2020-09-11 DIAGNOSIS — I10 Essential (primary) hypertension: Secondary | ICD-10-CM | POA: Diagnosis not present

## 2020-09-11 DIAGNOSIS — M4802 Spinal stenosis, cervical region: Secondary | ICD-10-CM | POA: Diagnosis not present

## 2020-09-30 DIAGNOSIS — E1129 Type 2 diabetes mellitus with other diabetic kidney complication: Secondary | ICD-10-CM | POA: Diagnosis not present

## 2020-10-23 DIAGNOSIS — L820 Inflamed seborrheic keratosis: Secondary | ICD-10-CM | POA: Diagnosis not present

## 2020-10-23 DIAGNOSIS — L851 Acquired keratosis [keratoderma] palmaris et plantaris: Secondary | ICD-10-CM | POA: Diagnosis not present

## 2020-10-23 DIAGNOSIS — L814 Other melanin hyperpigmentation: Secondary | ICD-10-CM | POA: Diagnosis not present

## 2020-10-27 DIAGNOSIS — E1129 Type 2 diabetes mellitus with other diabetic kidney complication: Secondary | ICD-10-CM | POA: Diagnosis not present

## 2020-10-30 DIAGNOSIS — E1129 Type 2 diabetes mellitus with other diabetic kidney complication: Secondary | ICD-10-CM | POA: Diagnosis not present

## 2020-10-31 ENCOUNTER — Ambulatory Visit (INDEPENDENT_AMBULATORY_CARE_PROVIDER_SITE_OTHER): Payer: Medicare PPO | Admitting: Ophthalmology

## 2020-10-31 ENCOUNTER — Other Ambulatory Visit: Payer: Self-pay

## 2020-10-31 ENCOUNTER — Encounter (INDEPENDENT_AMBULATORY_CARE_PROVIDER_SITE_OTHER): Payer: Self-pay | Admitting: Ophthalmology

## 2020-10-31 DIAGNOSIS — H43813 Vitreous degeneration, bilateral: Secondary | ICD-10-CM

## 2020-10-31 DIAGNOSIS — H35043 Retinal micro-aneurysms, unspecified, bilateral: Secondary | ICD-10-CM | POA: Diagnosis not present

## 2020-10-31 DIAGNOSIS — E113393 Type 2 diabetes mellitus with moderate nonproliferative diabetic retinopathy without macular edema, bilateral: Secondary | ICD-10-CM

## 2020-10-31 DIAGNOSIS — Z961 Presence of intraocular lens: Secondary | ICD-10-CM | POA: Insufficient documentation

## 2020-10-31 DIAGNOSIS — E113293 Type 2 diabetes mellitus with mild nonproliferative diabetic retinopathy without macular edema, bilateral: Secondary | ICD-10-CM

## 2020-10-31 HISTORY — DX: Type 2 diabetes mellitus with mild nonproliferative diabetic retinopathy without macular edema, bilateral: E11.3293

## 2020-10-31 NOTE — Assessment & Plan Note (Signed)

## 2020-10-31 NOTE — Assessment & Plan Note (Signed)
The nature of moderate nonproliferative diabetic retinopathy was discussed with the patient as well as the need for more frequent follow up to judge for progression. Good blood glucose, blood pressure, and serum lipid control was recommended as well as avoidance of smoking and maintenance of normal weight.  Close follow up with PCP encouraged. °

## 2020-10-31 NOTE — Progress Notes (Signed)
10/31/2020     CHIEF COMPLAINT Patient presents for Retina Follow Up (1 Year Diabetic F/U OU//Pt sts print is getting smaller when reading OU. No other new symptoms reported OU.//A1c: 6.2 (?), 08/2020/LBS: 145 after lunch today)   HISTORY OF PRESENT ILLNESS: Cindy Lester is a 74 y.o. female who presents to the clinic today for:   HPI    Retina Follow Up    Patient presents with  Diabetic Retinopathy.  In both eyes.  This started 1 year ago.  Severity is mild.  Duration of 1 year.  Since onset it is stable. Additional comments: 1 Year Diabetic F/U OU  Pt sts print is getting smaller when reading OU. No other new symptoms reported OU.  A1c: 6.2 (?), 08/2020 LBS: 145 after lunch today       Last edited by Rockie Neighbours, [place] on 10/31/2020  1:16 PM. (History)      Referring physician: Burnard Bunting, MD [place],  [place] [address]  HISTORICAL INFORMATION:   Selected notes from the [facility]: No current outpatient medications on file. (Ophthalmic Drugs)   No current facility-administered medications for this visit. (Ophthalmic Drugs)   Current Outpatient Medications (Other)  Medication Sig  . aspirin 81 MG tablet Take 81 mg by mouth daily.    Marland Kitchen atorvastatin (LIPITOR) 40 MG tablet Take 40 mg by mouth daily.   . calcium-vitamin D (OSCAL WITH D) 500-200 MG-UNIT per tablet Take 1 tablet by mouth daily.  . Canagliflozin (INVOKANA) 100 MG TABS Take by mouth.  . Cholecalciferol (VITAMIN D3 PO) Take by mouth daily.  Marland Kitchen CINNAMON PO Take by mouth daily.  Marland Kitchen co-enzyme Q-10 50 MG capsule Take 200 mg by mouth daily.  Marland Kitchen etodolac (LODINE) 400 MG tablet Take 400 mg by mouth daily.  . fish oil-omega-3 fatty acids 1000 MG capsule Take 2 g by mouth daily.  . furosemide (LASIX) 40 MG tablet Take 40 mg by mouth daily.   Marland Kitchen gabapentin (NEURONTIN) 100 MG capsule Take 1 capsule (100 mg total) by mouth at bedtime.  Marland Kitchen  glucosamine-chondroitin 500-400 MG tablet Take 1 tablet by mouth 3 (three) times daily.  . insulin regular (NOVOLIN R,HUMULIN R) 100 units/mL injection Inject into the skin as directed.  Marland Kitchen MAGNESIUM-ZINC PO Take by mouth daily.  . metFORMIN (GLUMETZA) 500 MG (MOD) 24 hr tablet Take 500 mg by mouth daily with breakfast.   . Multiple Vitamin (MULTIVITAMIN) capsule Take 1 capsule by mouth daily.    Marland Kitchen Resveratrol 50 MG CAPS Take by mouth.  . valsartan (DIOVAN) 160 MG tablet Take 160 mg by mouth daily.  . vitamin B-12 (CYANOCOBALAMIN) 1000 MCG tablet Take 1,000 mcg by mouth daily.   No current facility-administered medications for this visit. (Other)      REVIEW OF SYSTEMS:    ALLERGIES Allergies  Allergen Reactions  . Codeine Nausea And Vomiting  . Tape Other (See Comments)    Burns skin    PAST MEDICAL HISTORY Past Medical History:  Diagnosis Date  . Colon polyps   . Diabetes mellitus   . Diverticulosis   . GERD (gastroesophageal reflux disease)    occ-no meds  . Hyperlipidemia   . Hypertension   . Neuropathy   . Nonproliferative diabetic retinopathy of both eyes ([place]) 10/31/2020  . Osteoarthritis   . Tingling   . Wears glasses    Past Surgical History:  Procedure Laterality Date  .  ABDOMINAL HYSTERECTOMY  1987  . COLONOSCOPY    . GANGLION CYST EXCISION Right 03/17/2013   Procedure: EXCISION MASS RIGHT WRIST;  Surgeon: Wynonia Sours, MD;  Location: [facility];  Service: Orthopedics;  Laterality: Right;  . HAND SURGERY Right    right-as child    FAMILY HISTORY Family History  Problem Relation Age of Onset  . Coronary artery disease Father   . Bladder Cancer Father   . Melanoma Father   . Alcoholism Mother        died at age 75 in a fire - unsure of health history  . Breast cancer Neg Hx     SOCIAL HISTORY Social History   Tobacco Use  . Smoking status: Never Smoker  . Smokeless tobacco: Never Used  Vaping Use  . Vaping Use: Never used   Substance Use Topics  . Alcohol use: Yes    Comment: occaisonal wine  . Drug use: No         OPHTHALMIC EXAM:  Base Eye Exam    Visual Acuity (ETDRS)      Right Left   Dist cc 20/20 -2 20/25 +2   Correction: Glasses       Tonometry (Tonopen, 1:16 PM)      Right Left   Pressure 19 18       Pupils      Pupils Dark Light Shape React APD   Right PERRL 5 4 Round Brisk None   Left PERRL 5 4 Round Brisk None       Visual Fields (Counting fingers)      Left Right    Full Full       Extraocular Movement      Right Left    Full Full       Neuro/Psych    Oriented x3: Yes   Mood/Affect: Normal       Dilation    Both eyes: 1.0% Mydriacyl, 2.5% Phenylephrine @ 1:19 PM        Slit Lamp and Fundus Exam    External Exam      Right Left   External Normal Normal       Slit Lamp Exam      Right Left   Lids/Lashes Normal Normal   Conjunctiva/Sclera White and quiet White and quiet   Cornea Clear Clear   Anterior Chamber Deep and quiet Deep and quiet   Iris Round and reactive Round and reactive   Lens Centered posterior chamber intraocular lens Centered posterior chamber intraocular lens   Anterior Vitreous Normal Normal       Fundus Exam      Right Left   Posterior Vitreous Posterior vitreous detachment    C/D Ratio 0.15 0.35   Vessels NPDR- Moderate NPDR- Moderate          IMAGING AND PROCEDURES  Imaging and Procedures for 10/31/20  OCT, Retina - OU - Both Eyes       Right Eye Quality was good. Scan locations included subfoveal. Central Foveal Thickness: 256. Progression has been stable. Findings include normal foveal contour.   Left Eye Quality was good. Scan locations included subfoveal. Central Foveal Thickness: 264. Progression has been stable. Findings include normal foveal contour.   Notes Incidental posterior vitreous detachment OU                ASSESSMENT/PLAN:  Moderate nonproliferative diabetic retinopathy of both eyes  (HCC) The nature of moderate nonproliferative diabetic retinopathy was discussed with  the patient as well as the need for more frequent follow up to judge for progression. Good blood glucose, blood pressure, and serum lipid control was recommended as well as avoidance of smoking and maintenance of normal weight.  Close follow up with PCP encouraged.  Posterior vitreous detachment of both eyes   The nature of posterior vitreous detachment was discussed with the patient as well as its physiology, its age prevalence, and its possible implication regarding retinal breaks and detachment.  An informational brochure was given to the patient.  All the patient's questions were answered.  The patient was asked to return if new or different flashes or floaters develops.   Patient was instructed to contact office immediately if any changes were noticed. I explained to the patient that vitreous inside the eye is similar to jello inside a bowl. As the jello melts it can start to pull away from the bowl, similarly the vitreous throughout our lives can begin to pull away from the retina. That process is called a posterior vitreous detachment. In some cases, the vitreous can tug hard enough on the retina to form a retinal tear. I discussed with the patient the signs and symptoms of a retinal detachment.  Do not rub the eye.      ICD-10-CM   1. Moderate nonproliferative diabetic retinopathy of both eyes without macular edema associated with type 2 diabetes mellitus (HCC)  [id number] OCT, Retina - OU - Both Eyes  2. Posterior vitreous detachment of both eyes  H43.813 OCT, Retina - OU - Both Eyes  3. Retinal microaneurysm of both eyes  H35.043   4. Pseudophakia  Z96.1     1. Moderate nonproliferative diabetic retinopathy OU, no interval change in severity of retinopathy. Excellent blood sugar control is maintained.  2.  3.  Ophthalmic Meds Ordered this visit:  No orders of the defined types were placed in this  encounter.      Return in about 1 year (around 10/31/2021) for DILATE OU, OCT.  There are no Patient Instructions on file for this visit.   Explained the diagnoses, plan, and follow up with the patient and they expressed understanding.  Patient expressed understanding of the importance of proper follow up care.   Clent Demark Erin Uecker M.D. Diseases & Surgery of the Retina and Vitreous Retina & Diabetic [place] 10/31/20     Abbreviations: M myopia (nearsighted); A astigmatism; H hyperopia (farsighted); P presbyopia; Mrx spectacle prescription;  CTL contact lenses; OD right eye; OS left eye; OU both eyes  XT exotropia; ET esotropia; PEK punctate epithelial keratitis; PEE punctate epithelial erosions; DES dry eye syndrome; MGD meibomian gland dysfunction; ATs artificial tears; PFAT's preservative free artificial tears; [place] nuclear sclerotic cataract; PSC posterior subcapsular cataract; ERM epi-retinal membrane; PVD posterior vitreous detachment; RD retinal detachment; DM diabetes mellitus; DR diabetic retinopathy; NPDR non-proliferative diabetic retinopathy; PDR proliferative diabetic retinopathy; CSME clinically significant macular edema; DME diabetic macular edema; dbh dot blot hemorrhages; CWS cotton wool spot; POAG primary open angle glaucoma; C/D cup-to-disc ratio; HVF humphrey visual field; GVF goldmann visual field; OCT optical coherence tomography; IOP intraocular pressure; BRVO Branch retinal vein occlusion; CRVO central retinal vein occlusion; CRAO central retinal artery occlusion; BRAO branch retinal artery occlusion; RT retinal tear; SB scleral buckle; PPV pars plana vitrectomy; VH Vitreous hemorrhage; PRP panretinal laser photocoagulation; IVK intravitreal kenalog; VMT vitreomacular traction; MH Macular hole;  NVD neovascularization of the disc; NVE neovascularization elsewhere; AREDS age related eye disease study; ARMD age related macular  degeneration; POAG primary open angle glaucoma;  EBMD epithelial/anterior basement membrane dystrophy; ACIOL anterior chamber intraocular lens; IOL intraocular lens; PCIOL posterior chamber intraocular lens; Phaco/IOL phacoemulsification with intraocular lens placement; [place] photorefractive keratectomy; LASIK laser assisted in situ keratomileusis; HTN hypertension; DM diabetes mellitus; COPD chronic obstructive pulmonary disease

## 2020-11-30 DIAGNOSIS — E1129 Type 2 diabetes mellitus with other diabetic kidney complication: Secondary | ICD-10-CM | POA: Diagnosis not present

## 2020-12-05 DIAGNOSIS — I10 Essential (primary) hypertension: Secondary | ICD-10-CM | POA: Diagnosis not present

## 2020-12-05 DIAGNOSIS — Z6838 Body mass index (BMI) 38.0-38.9, adult: Secondary | ICD-10-CM | POA: Diagnosis not present

## 2020-12-05 DIAGNOSIS — M4712 Other spondylosis with myelopathy, cervical region: Secondary | ICD-10-CM | POA: Diagnosis not present

## 2020-12-31 DIAGNOSIS — E1129 Type 2 diabetes mellitus with other diabetic kidney complication: Secondary | ICD-10-CM | POA: Diagnosis not present

## 2021-01-03 DIAGNOSIS — Z794 Long term (current) use of insulin: Secondary | ICD-10-CM | POA: Diagnosis not present

## 2021-01-03 DIAGNOSIS — R197 Diarrhea, unspecified: Secondary | ICD-10-CM | POA: Diagnosis not present

## 2021-01-03 DIAGNOSIS — N1831 Chronic kidney disease, stage 3a: Secondary | ICD-10-CM | POA: Diagnosis not present

## 2021-01-03 DIAGNOSIS — I129 Hypertensive chronic kidney disease with stage 1 through stage 4 chronic kidney disease, or unspecified chronic kidney disease: Secondary | ICD-10-CM | POA: Diagnosis not present

## 2021-01-03 DIAGNOSIS — E1129 Type 2 diabetes mellitus with other diabetic kidney complication: Secondary | ICD-10-CM | POA: Diagnosis not present

## 2021-01-03 DIAGNOSIS — I10 Essential (primary) hypertension: Secondary | ICD-10-CM | POA: Diagnosis not present

## 2021-01-03 DIAGNOSIS — E669 Obesity, unspecified: Secondary | ICD-10-CM | POA: Diagnosis not present

## 2021-01-15 DIAGNOSIS — E1129 Type 2 diabetes mellitus with other diabetic kidney complication: Secondary | ICD-10-CM | POA: Diagnosis not present

## 2021-01-17 DIAGNOSIS — E1129 Type 2 diabetes mellitus with other diabetic kidney complication: Secondary | ICD-10-CM | POA: Diagnosis not present

## 2021-01-28 DIAGNOSIS — E1129 Type 2 diabetes mellitus with other diabetic kidney complication: Secondary | ICD-10-CM | POA: Diagnosis not present

## 2021-02-28 DIAGNOSIS — E1129 Type 2 diabetes mellitus with other diabetic kidney complication: Secondary | ICD-10-CM | POA: Diagnosis not present

## 2021-03-30 DIAGNOSIS — E1129 Type 2 diabetes mellitus with other diabetic kidney complication: Secondary | ICD-10-CM | POA: Diagnosis not present

## 2021-04-11 DIAGNOSIS — U071 COVID-19: Secondary | ICD-10-CM | POA: Diagnosis not present

## 2021-04-19 DIAGNOSIS — E1129 Type 2 diabetes mellitus with other diabetic kidney complication: Secondary | ICD-10-CM | POA: Diagnosis not present

## 2021-04-30 DIAGNOSIS — E1129 Type 2 diabetes mellitus with other diabetic kidney complication: Secondary | ICD-10-CM | POA: Diagnosis not present

## 2021-05-07 IMAGING — MG DIGITAL SCREENING BILAT W/ TOMO W/ CAD
6 of 10 series · 6 of 30 positions shown · non-contrast
Comparison: Previous exam(s).

CLINICAL DATA: Screening.

EXAM:
DIGITAL SCREENING BILATERAL MAMMOGRAM WITH TOMO AND CAD

[R MLO synth-2D (1 of 2)]
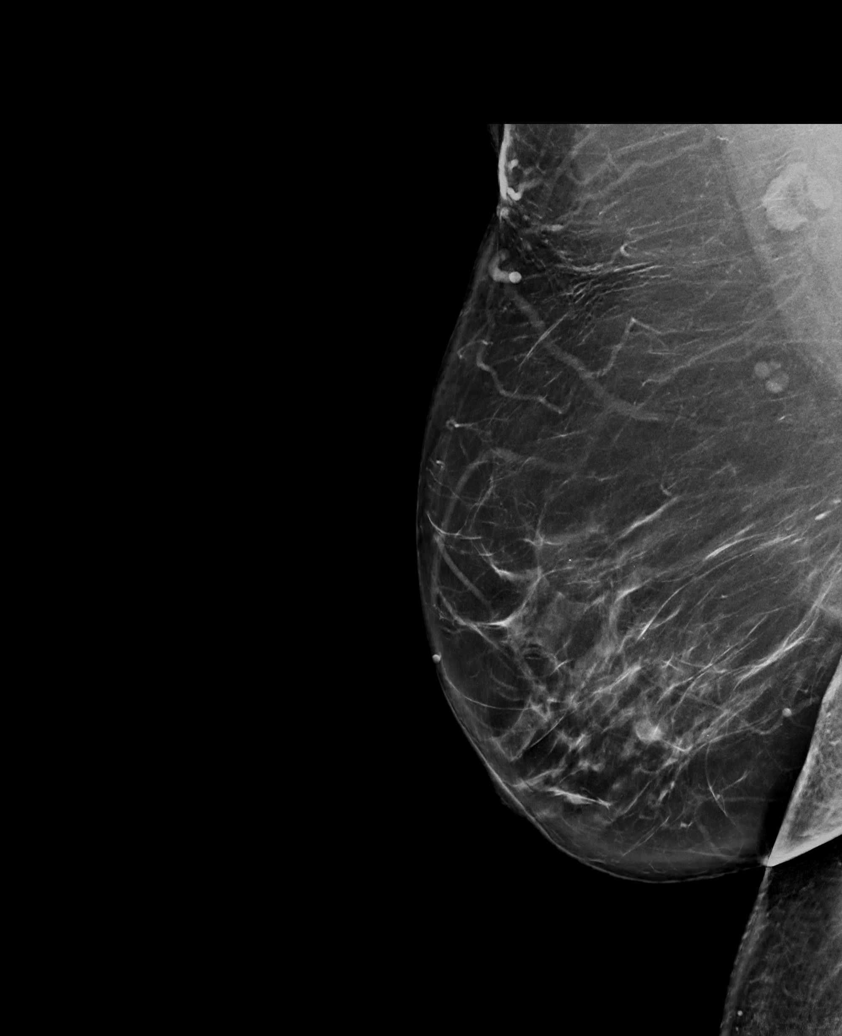

[L CC synth-2D]
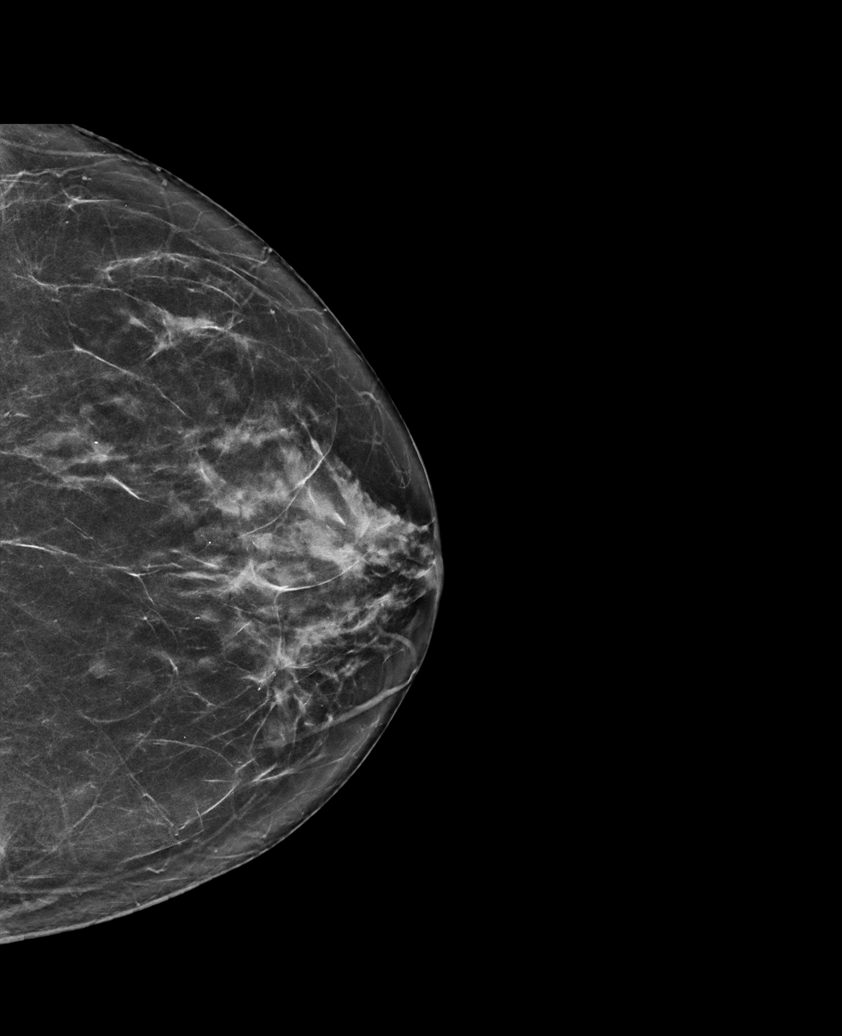

[R MLO synth-2D (2 of 2)]
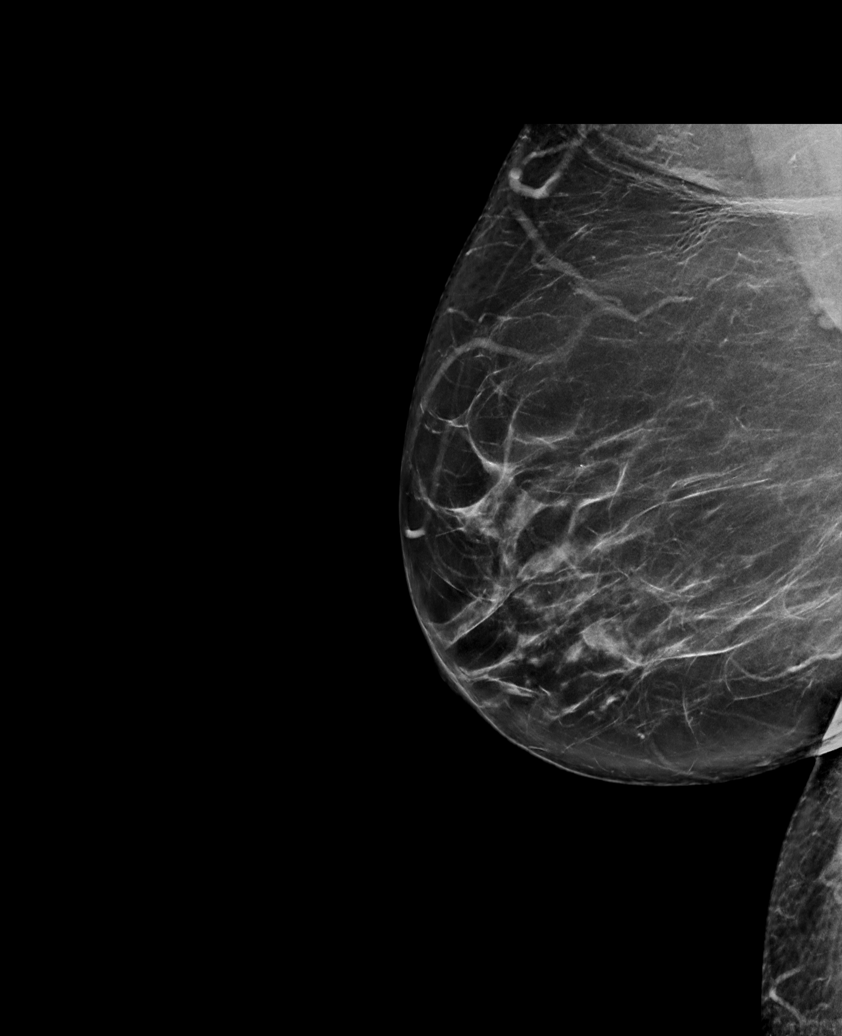

[L MLO synth-2D]
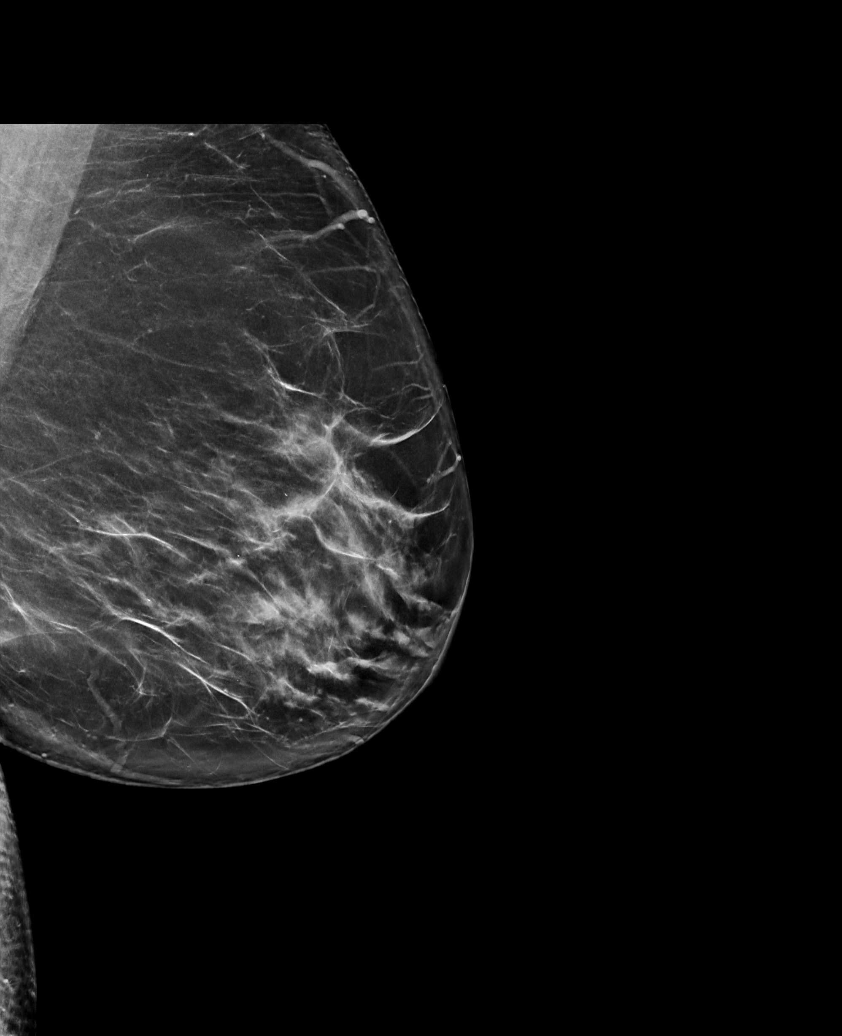

[R CC synth-2D]
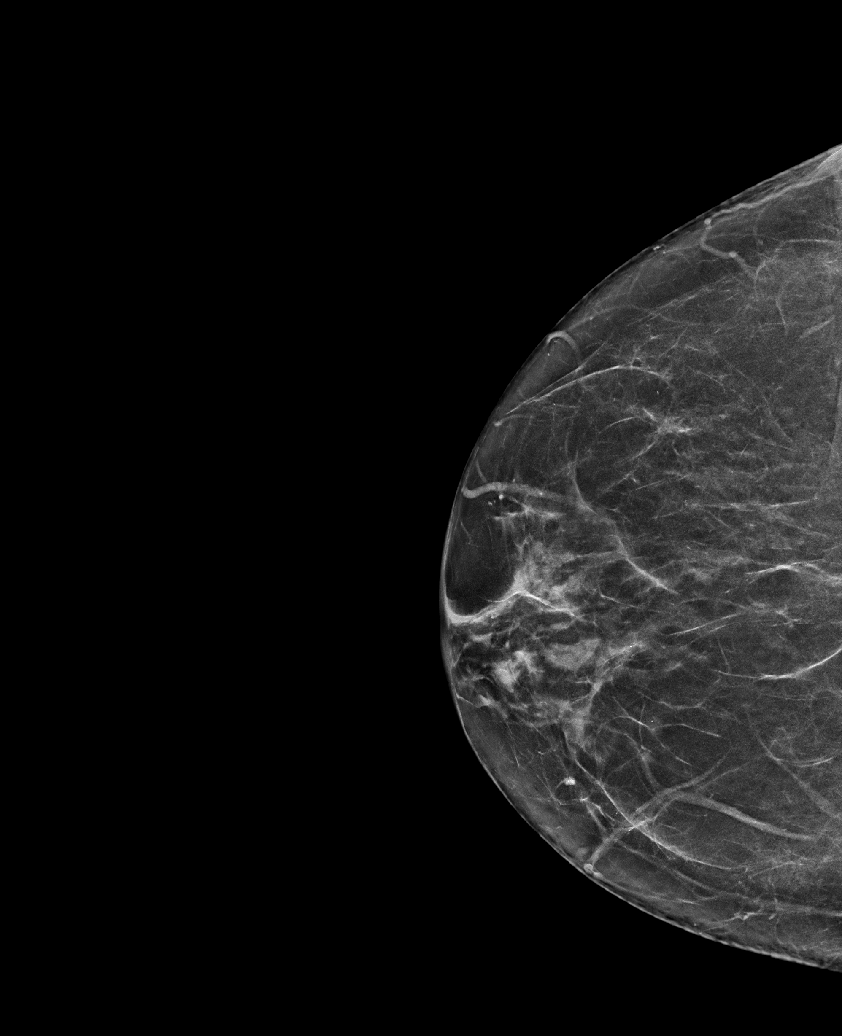

[R MLO tomo · tomo slice 47/94.0]
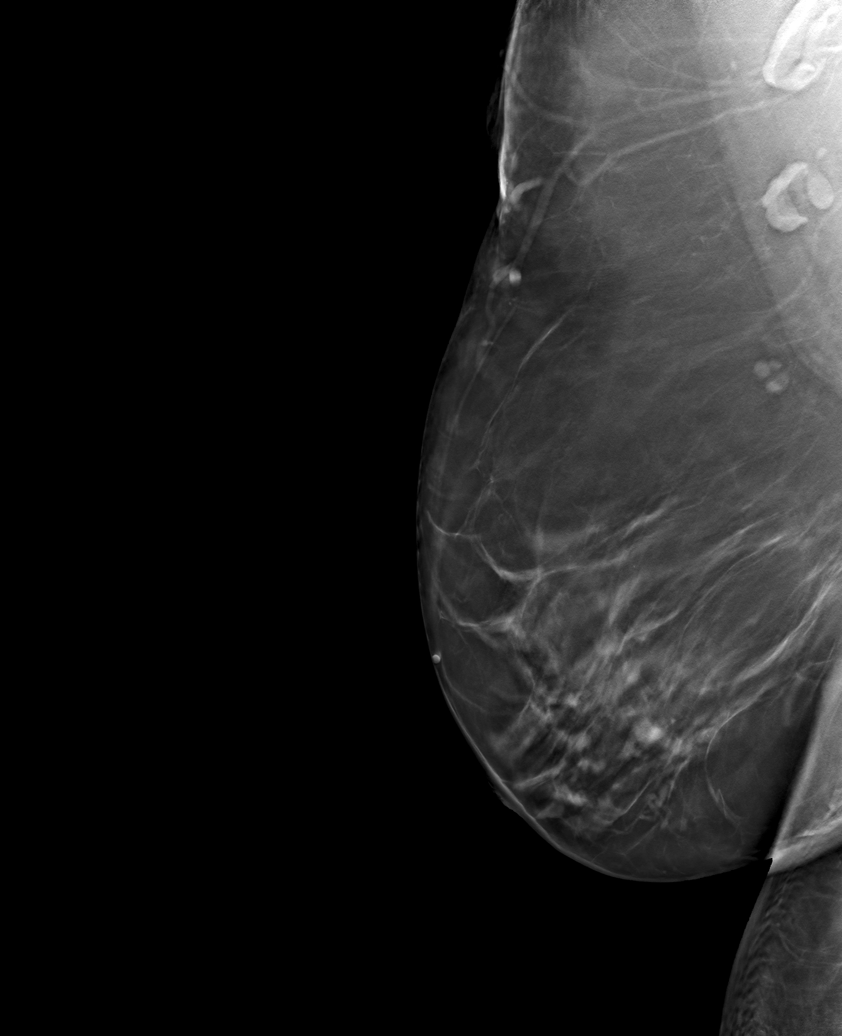

[6 of 30 positions shown; findings below may reference images not displayed]

ACR Breast Density Category b: There are scattered areas of
fibroglandular density.
FINDINGS: There are no findings suspicious for malignancy. Images were
processed with CAD.
IMPRESSION: No mammographic evidence of malignancy. A result letter of this
screening mammogram will be mailed directly to the patient.

RECOMMENDATION:
Screening mammogram in one year. (Code:[id number])

BI-RADS CATEGORY  1: Negative.

## 2021-05-08 ENCOUNTER — Other Ambulatory Visit: Payer: Self-pay | Admitting: Internal Medicine

## 2021-05-08 DIAGNOSIS — Z1231 Encounter for screening mammogram for malignant neoplasm of breast: Secondary | ICD-10-CM

## 2021-05-16 DIAGNOSIS — Z794 Long term (current) use of insulin: Secondary | ICD-10-CM | POA: Diagnosis not present

## 2021-05-16 DIAGNOSIS — E669 Obesity, unspecified: Secondary | ICD-10-CM | POA: Diagnosis not present

## 2021-05-16 DIAGNOSIS — I129 Hypertensive chronic kidney disease with stage 1 through stage 4 chronic kidney disease, or unspecified chronic kidney disease: Secondary | ICD-10-CM | POA: Diagnosis not present

## 2021-05-16 DIAGNOSIS — E1129 Type 2 diabetes mellitus with other diabetic kidney complication: Secondary | ICD-10-CM | POA: Diagnosis not present

## 2021-05-16 DIAGNOSIS — N1831 Chronic kidney disease, stage 3a: Secondary | ICD-10-CM | POA: Diagnosis not present

## 2021-05-19 DIAGNOSIS — E1129 Type 2 diabetes mellitus with other diabetic kidney complication: Secondary | ICD-10-CM | POA: Diagnosis not present

## 2021-05-30 DIAGNOSIS — E1129 Type 2 diabetes mellitus with other diabetic kidney complication: Secondary | ICD-10-CM | POA: Diagnosis not present

## 2021-05-31 DIAGNOSIS — E1129 Type 2 diabetes mellitus with other diabetic kidney complication: Secondary | ICD-10-CM | POA: Diagnosis not present

## 2021-06-05 DIAGNOSIS — M4712 Other spondylosis with myelopathy, cervical region: Secondary | ICD-10-CM | POA: Diagnosis not present

## 2021-06-30 DIAGNOSIS — E1129 Type 2 diabetes mellitus with other diabetic kidney complication: Secondary | ICD-10-CM | POA: Diagnosis not present

## 2021-07-18 ENCOUNTER — Ambulatory Visit
Admission: RE | Admit: 2021-07-18 | Discharge: 2021-07-18 | Disposition: A | Discharge status: Home / Self Care | Payer: Medicare PPO | Source: Ambulatory Visit | Attending: Internal Medicine | Admitting: Internal Medicine

## 2021-07-18 ENCOUNTER — Other Ambulatory Visit: Payer: Self-pay

## 2021-07-18 DIAGNOSIS — Z1231 Encounter for screening mammogram for malignant neoplasm of breast: Secondary | ICD-10-CM

## 2021-07-31 DIAGNOSIS — E1129 Type 2 diabetes mellitus with other diabetic kidney complication: Secondary | ICD-10-CM | POA: Diagnosis not present

## 2021-08-15 DIAGNOSIS — E1129 Type 2 diabetes mellitus with other diabetic kidney complication: Secondary | ICD-10-CM | POA: Diagnosis not present

## 2021-09-19 DIAGNOSIS — I10 Essential (primary) hypertension: Secondary | ICD-10-CM | POA: Diagnosis not present

## 2021-09-19 DIAGNOSIS — E1129 Type 2 diabetes mellitus with other diabetic kidney complication: Secondary | ICD-10-CM | POA: Diagnosis not present

## 2021-09-19 DIAGNOSIS — E785 Hyperlipidemia, unspecified: Secondary | ICD-10-CM | POA: Diagnosis not present

## 2021-09-19 DIAGNOSIS — M859 Disorder of bone density and structure, unspecified: Secondary | ICD-10-CM | POA: Diagnosis not present

## 2021-09-28 DIAGNOSIS — R82998 Other abnormal findings in urine: Secondary | ICD-10-CM | POA: Diagnosis not present

## 2021-09-28 DIAGNOSIS — E1129 Type 2 diabetes mellitus with other diabetic kidney complication: Secondary | ICD-10-CM | POA: Diagnosis not present

## 2021-09-28 DIAGNOSIS — Z1212 Encounter for screening for malignant neoplasm of rectum: Secondary | ICD-10-CM | POA: Diagnosis not present

## 2021-09-28 DIAGNOSIS — I10 Essential (primary) hypertension: Secondary | ICD-10-CM | POA: Diagnosis not present

## 2021-09-28 DIAGNOSIS — M8580 Other specified disorders of bone density and structure, unspecified site: Secondary | ICD-10-CM | POA: Diagnosis not present

## 2021-09-28 DIAGNOSIS — N1831 Chronic kidney disease, stage 3a: Secondary | ICD-10-CM | POA: Diagnosis not present

## 2021-09-28 DIAGNOSIS — Z0000 Encounter for general adult medical examination without abnormal findings: Secondary | ICD-10-CM | POA: Diagnosis not present

## 2021-09-28 DIAGNOSIS — I129 Hypertensive chronic kidney disease with stage 1 through stage 4 chronic kidney disease, or unspecified chronic kidney disease: Secondary | ICD-10-CM | POA: Diagnosis not present

## 2021-09-28 DIAGNOSIS — Z1339 Encounter for screening examination for other mental health and behavioral disorders: Secondary | ICD-10-CM | POA: Diagnosis not present

## 2021-09-28 DIAGNOSIS — Z1331 Encounter for screening for depression: Secondary | ICD-10-CM | POA: Diagnosis not present

## 2021-09-28 DIAGNOSIS — Z794 Long term (current) use of insulin: Secondary | ICD-10-CM | POA: Diagnosis not present

## 2021-10-17 DIAGNOSIS — E1129 Type 2 diabetes mellitus with other diabetic kidney complication: Secondary | ICD-10-CM | POA: Diagnosis not present

## 2021-11-01 ENCOUNTER — Encounter (INDEPENDENT_AMBULATORY_CARE_PROVIDER_SITE_OTHER): Payer: Medicare PPO | Admitting: Ophthalmology

## 2021-11-08 ENCOUNTER — Encounter (INDEPENDENT_AMBULATORY_CARE_PROVIDER_SITE_OTHER): Payer: Medicare PPO | Admitting: Ophthalmology

## 2021-11-08 DIAGNOSIS — E1129 Type 2 diabetes mellitus with other diabetic kidney complication: Secondary | ICD-10-CM | POA: Diagnosis not present

## 2021-11-13 ENCOUNTER — Encounter (INDEPENDENT_AMBULATORY_CARE_PROVIDER_SITE_OTHER): Payer: Self-pay | Admitting: Ophthalmology

## 2021-11-13 ENCOUNTER — Ambulatory Visit (INDEPENDENT_AMBULATORY_CARE_PROVIDER_SITE_OTHER): Payer: Medicare PPO | Admitting: Ophthalmology

## 2021-11-13 ENCOUNTER — Other Ambulatory Visit: Payer: Self-pay

## 2021-11-13 DIAGNOSIS — H43813 Vitreous degeneration, bilateral: Secondary | ICD-10-CM | POA: Diagnosis not present

## 2021-11-13 DIAGNOSIS — E113393 Type 2 diabetes mellitus with moderate nonproliferative diabetic retinopathy without macular edema, bilateral: Secondary | ICD-10-CM

## 2021-11-13 NOTE — Assessment & Plan Note (Signed)
Physiologic OU stable 

## 2021-11-13 NOTE — Assessment & Plan Note (Signed)
The nature of moderate nonproliferative diabetic retinopathy was discussed with the patient as well as the need for more frequent follow up to judge for progression. Good blood glucose, blood pressure, and serum lipid control was recommended as well as avoidance of smoking and maintenance of normal weight.  Close follow up with PCP encouraged. °

## 2021-11-13 NOTE — Progress Notes (Signed)
11/13/2021     CHIEF COMPLAINT Patient presents for  Chief Complaint  Patient presents with   Retina Follow Up    1 Year Diabetic F/U OU  Pt sts print is getting smaller when reading OU. No other new symptoms reported OU.  A1c: 6.2 (?), 08/2020 LBS: 145 after lunch today      HISTORY OF PRESENT ILLNESS: Cindy Lester is a 76 y.o. female who presents to the clinic today for:   HPI     Retina Follow Up           Diagnosis: Diabetic Retinopathy   Laterality: both eyes   Onset: 1 year ago   Severity: mild   Duration: 1 year   Course: Worse at near   Comments: 1 Year Diabetic F/U OU  Pt sts print is getting smaller when reading OU. No other new symptoms reported OU.  A1c: 6.2 (?), 08/2020 LBS: 145 after lunch today         Comments   1 yr fu OU OCT. Pt states "reading vision is getting worse, I cannot read the small print hardly at all."      Last edited by Laurin Coder on 11/13/2021  2:51 PM.      Referring physician: Burnard Bunting, MD [place],  [place] [id number]  HISTORICAL INFORMATION:   Selected notes from the [facility]: No current outpatient medications on file. (Ophthalmic Drugs)   No current facility-administered medications for this visit. (Ophthalmic Drugs)   Current Outpatient Medications (Other)  Medication Sig   aspirin 81 MG tablet Take 81 mg by mouth daily.     atorvastatin (LIPITOR) 40 MG tablet Take 40 mg by mouth daily.    calcium-vitamin D (OSCAL WITH D) 500-200 MG-UNIT per tablet Take 1 tablet by mouth daily.   Canagliflozin (INVOKANA) 100 MG TABS Take by mouth.   Cholecalciferol (VITAMIN D3 PO) Take by mouth daily.   CINNAMON PO Take by mouth daily.   co-enzyme Q-10 50 MG capsule Take 200 mg by mouth daily.   etodolac (LODINE) 400 MG tablet Take 400 mg by mouth daily.   fish oil-omega-3 fatty acids 1000 MG capsule Take 2 g by mouth daily.   furosemide (LASIX) 40 MG  tablet Take 40 mg by mouth daily.    gabapentin (NEURONTIN) 100 MG capsule Take 1 capsule (100 mg total) by mouth at bedtime.   glucosamine-chondroitin 500-400 MG tablet Take 1 tablet by mouth 3 (three) times daily.   insulin regular (NOVOLIN R,HUMULIN R) 100 units/mL injection Inject into the skin as directed.   MAGNESIUM-ZINC PO Take by mouth daily.   metFORMIN (GLUMETZA) 500 MG (MOD) 24 hr tablet Take 500 mg by mouth daily with breakfast.    Multiple Vitamin (MULTIVITAMIN) capsule Take 1 capsule by mouth daily.     Resveratrol 50 MG CAPS Take by mouth.   valsartan (DIOVAN) 160 MG tablet Take 160 mg by mouth daily.   vitamin B-12 (CYANOCOBALAMIN) 1000 MCG tablet Take 1,000 mcg by mouth daily.   No current facility-administered medications for this visit. (Other)      REVIEW OF SYSTEMS:    ALLERGIES Allergies  Allergen Reactions   Codeine Nausea And Vomiting   Penicillins    Tape Other (See Comments)    Burns skin    PAST MEDICAL HISTORY Past Medical History:  Diagnosis Date   Colon polyps    Diabetes mellitus  Diverticulosis    GERD (gastroesophageal reflux disease)    occ-no meds   Hyperlipidemia    Hypertension    Neuropathy    Nonproliferative diabetic retinopathy of both eyes ([place]) 10/31/2020   Osteoarthritis    Tingling    Wears glasses    Past Surgical History:  Procedure Laterality Date   ABDOMINAL HYSTERECTOMY  1987   COLONOSCOPY     GANGLION CYST EXCISION Right 03/17/2013   Procedure: EXCISION MASS RIGHT WRIST;  Surgeon: Wynonia Sours, MD;  Location: [facility];  Service: Orthopedics;  Laterality: Right;   HAND SURGERY Right    right-as child    FAMILY HISTORY Family History  Problem Relation Age of Onset   Coronary artery disease Father    Bladder Cancer Father    Melanoma Father    Alcoholism Mother        died at age 50 in a fire - unsure of health history   Breast cancer Neg Hx     SOCIAL HISTORY Social History    Tobacco Use   Smoking status: Never   Smokeless tobacco: Never  Vaping Use   Vaping Use: Never used  Substance Use Topics   Alcohol use: Yes    Comment: occaisonal wine   Drug use: No         OPHTHALMIC EXAM:  Base Eye Exam     Visual Acuity (ETDRS)       Right Left   Dist cc 20/25 -1 20/20 -1  Pt says in her right eye she notices a big floater in the center of vision        Tonometry     Tonopen         Pupils       Pupils APD   Right PERRL None   Left PERRL None         Extraocular Movement       Right Left    Full Full         Neuro/Psych     Oriented x3: Yes   Mood/Affect: Normal         Dilation     Both eyes: 1.0% Mydriacyl, 2.5% Phenylephrine            Slit Lamp and Fundus Exam     External Exam       Right Left   External Normal Normal         Slit Lamp Exam       Right Left   Lids/Lashes Normal Normal   Conjunctiva/Sclera White and quiet White and quiet   Cornea Clear Clear   Anterior Chamber Deep and quiet Deep and quiet   Iris Round and reactive Round and reactive   Lens Centered posterior chamber intraocular lens Centered posterior chamber intraocular lens   Anterior Vitreous Normal Normal         Fundus Exam       Right Left   Posterior Vitreous Posterior vitreous detachment Posterior vitreous detachment   Disc Normal Normal   C/D Ratio 0.15 0.25   Macula Normal Normal   Vessels NPDR- Moderate NPDR- Moderate   Periphery Normal Normal            IMAGING AND PROCEDURES  Imaging and Procedures for 11/13/21  OCT, Retina - OU - Both Eyes       Right Eye Quality was good. Scan locations included subfoveal. Central Foveal Thickness: 252. Progression has been stable. Findings include normal foveal  contour.   Left Eye Quality was good. Scan locations included subfoveal. Central Foveal Thickness: 261. Progression has been stable. Findings include normal foveal contour.   Notes Incidental  posterior vitreous detachment OU             ASSESSMENT/PLAN:  Moderate nonproliferative diabetic retinopathy of both eyes (HCC) The nature of moderate nonproliferative diabetic retinopathy was discussed with the patient as well as the need for more frequent follow up to judge for progression. Good blood glucose, blood pressure, and serum lipid control was recommended as well as avoidance of smoking and maintenance of normal weight.  Close follow up with PCP encouraged.  Posterior vitreous detachment of both eyes Physiologic OU stable      ICD-10-CM   1. Moderate nonproliferative diabetic retinopathy of both eyes without macular edema associated with type 2 diabetes mellitus (HCC)  Y65.0354 OCT, Retina - OU - Both Eyes    2. Posterior vitreous detachment of both eyes  H43.813       1.  OU, stable retinopathy, no progression  2.  Incidental posterior vitreous detachment OU, stable  3.  Ophthalmic Meds Ordered this visit:  No orders of the defined types were placed in this encounter.      Return in about 1 year (around 11/13/2022) for DILATE OU, COLOR FP, OCT.  There are no Patient Instructions on file for this visit.   Explained the diagnoses, plan, and follow up with the patient and they expressed understanding.  Patient expressed understanding of the importance of proper follow up care.   Clent Demark Joscelin Fray M.D. Diseases & Surgery of the Retina and Vitreous Retina & Diabetic [place] 11/13/21     Abbreviations: M myopia (nearsighted); A astigmatism; H hyperopia (farsighted); P presbyopia; Mrx spectacle prescription;  CTL contact lenses; OD right eye; OS left eye; OU both eyes  XT exotropia; ET esotropia; PEK punctate epithelial keratitis; PEE punctate epithelial erosions; DES dry eye syndrome; MGD meibomian gland dysfunction; ATs artificial tears; PFAT's preservative free artificial tears; [place] nuclear sclerotic cataract; PSC posterior subcapsular cataract; ERM  epi-retinal membrane; PVD posterior vitreous detachment; RD retinal detachment; DM diabetes mellitus; DR diabetic retinopathy; NPDR non-proliferative diabetic retinopathy; PDR proliferative diabetic retinopathy; CSME clinically significant macular edema; DME diabetic macular edema; dbh dot blot hemorrhages; CWS cotton wool spot; POAG primary open angle glaucoma; C/D cup-to-disc ratio; HVF humphrey visual field; GVF goldmann visual field; OCT optical coherence tomography; IOP intraocular pressure; BRVO Branch retinal vein occlusion; CRVO central retinal vein occlusion; CRAO central retinal artery occlusion; BRAO branch retinal artery occlusion; RT retinal tear; SB scleral buckle; PPV pars plana vitrectomy; VH Vitreous hemorrhage; PRP panretinal laser photocoagulation; IVK intravitreal kenalog; VMT vitreomacular traction; MH Macular hole;  NVD neovascularization of the disc; NVE neovascularization elsewhere; AREDS age related eye disease study; ARMD age related macular degeneration; POAG primary open angle glaucoma; EBMD epithelial/anterior basement membrane dystrophy; ACIOL anterior chamber intraocular lens; IOL intraocular lens; PCIOL posterior chamber intraocular lens; Phaco/IOL phacoemulsification with intraocular lens placement; [place] photorefractive keratectomy; LASIK laser assisted in situ keratomileusis; HTN hypertension; DM diabetes mellitus; COPD chronic obstructive pulmonary disease

## 2022-01-30 DIAGNOSIS — Z794 Long term (current) use of insulin: Secondary | ICD-10-CM | POA: Diagnosis not present

## 2022-01-30 DIAGNOSIS — E1129 Type 2 diabetes mellitus with other diabetic kidney complication: Secondary | ICD-10-CM | POA: Diagnosis not present

## 2022-01-30 DIAGNOSIS — N1831 Chronic kidney disease, stage 3a: Secondary | ICD-10-CM | POA: Diagnosis not present

## 2022-01-30 DIAGNOSIS — E669 Obesity, unspecified: Secondary | ICD-10-CM | POA: Diagnosis not present

## 2022-01-30 DIAGNOSIS — I129 Hypertensive chronic kidney disease with stage 1 through stage 4 chronic kidney disease, or unspecified chronic kidney disease: Secondary | ICD-10-CM | POA: Diagnosis not present

## 2022-02-06 DIAGNOSIS — E1129 Type 2 diabetes mellitus with other diabetic kidney complication: Secondary | ICD-10-CM | POA: Diagnosis not present

## 2022-05-01 DIAGNOSIS — I129 Hypertensive chronic kidney disease with stage 1 through stage 4 chronic kidney disease, or unspecified chronic kidney disease: Secondary | ICD-10-CM | POA: Diagnosis not present

## 2022-05-01 DIAGNOSIS — E669 Obesity, unspecified: Secondary | ICD-10-CM | POA: Diagnosis not present

## 2022-05-01 DIAGNOSIS — Z794 Long term (current) use of insulin: Secondary | ICD-10-CM | POA: Diagnosis not present

## 2022-05-01 DIAGNOSIS — E1129 Type 2 diabetes mellitus with other diabetic kidney complication: Secondary | ICD-10-CM | POA: Diagnosis not present

## 2022-05-01 DIAGNOSIS — N1831 Chronic kidney disease, stage 3a: Secondary | ICD-10-CM | POA: Diagnosis not present

## 2022-06-03 DIAGNOSIS — E1129 Type 2 diabetes mellitus with other diabetic kidney complication: Secondary | ICD-10-CM | POA: Diagnosis not present

## 2022-07-01 ENCOUNTER — Other Ambulatory Visit: Payer: Self-pay | Admitting: Internal Medicine

## 2022-07-01 DIAGNOSIS — Z1231 Encounter for screening mammogram for malignant neoplasm of breast: Secondary | ICD-10-CM

## 2022-08-28 ENCOUNTER — Ambulatory Visit: Payer: Medicare PPO

## 2022-09-16 ENCOUNTER — Ambulatory Visit
Admission: RE | Admit: 2022-09-16 | Discharge: 2022-09-16 | Disposition: A | Discharge status: Home / Self Care | Payer: Medicare PPO | Source: Ambulatory Visit | Attending: Internal Medicine | Admitting: Internal Medicine

## 2022-09-16 DIAGNOSIS — Z1231 Encounter for screening mammogram for malignant neoplasm of breast: Secondary | ICD-10-CM

## 2022-09-25 DIAGNOSIS — I10 Essential (primary) hypertension: Secondary | ICD-10-CM | POA: Diagnosis not present

## 2022-09-25 DIAGNOSIS — N1831 Chronic kidney disease, stage 3a: Secondary | ICD-10-CM | POA: Diagnosis not present

## 2022-09-25 DIAGNOSIS — E785 Hyperlipidemia, unspecified: Secondary | ICD-10-CM | POA: Diagnosis not present

## 2022-10-02 DIAGNOSIS — Z0000 Encounter for general adult medical examination without abnormal findings: Secondary | ICD-10-CM | POA: Diagnosis not present

## 2022-10-02 DIAGNOSIS — Z1212 Encounter for screening for malignant neoplasm of rectum: Secondary | ICD-10-CM | POA: Diagnosis not present

## 2022-10-02 DIAGNOSIS — Z794 Long term (current) use of insulin: Secondary | ICD-10-CM | POA: Diagnosis not present

## 2022-10-02 DIAGNOSIS — N1831 Chronic kidney disease, stage 3a: Secondary | ICD-10-CM | POA: Diagnosis not present

## 2022-10-02 DIAGNOSIS — R82998 Other abnormal findings in urine: Secondary | ICD-10-CM | POA: Diagnosis not present

## 2022-10-02 DIAGNOSIS — Z1339 Encounter for screening examination for other mental health and behavioral disorders: Secondary | ICD-10-CM | POA: Diagnosis not present

## 2022-10-02 DIAGNOSIS — E669 Obesity, unspecified: Secondary | ICD-10-CM | POA: Diagnosis not present

## 2022-10-02 DIAGNOSIS — Z1331 Encounter for screening for depression: Secondary | ICD-10-CM | POA: Diagnosis not present

## 2022-10-02 DIAGNOSIS — E1129 Type 2 diabetes mellitus with other diabetic kidney complication: Secondary | ICD-10-CM | POA: Diagnosis not present

## 2022-10-02 DIAGNOSIS — I129 Hypertensive chronic kidney disease with stage 1 through stage 4 chronic kidney disease, or unspecified chronic kidney disease: Secondary | ICD-10-CM | POA: Diagnosis not present

## 2022-10-04 DIAGNOSIS — E1129 Type 2 diabetes mellitus with other diabetic kidney complication: Secondary | ICD-10-CM | POA: Diagnosis not present

## 2022-11-14 ENCOUNTER — Encounter (INDEPENDENT_AMBULATORY_CARE_PROVIDER_SITE_OTHER): Payer: Self-pay

## 2022-11-14 ENCOUNTER — Encounter (INDEPENDENT_AMBULATORY_CARE_PROVIDER_SITE_OTHER): Payer: Medicare PPO | Admitting: Ophthalmology

## 2022-11-14 DIAGNOSIS — H43813 Vitreous degeneration, bilateral: Secondary | ICD-10-CM | POA: Diagnosis not present

## 2022-11-14 DIAGNOSIS — E113393 Type 2 diabetes mellitus with moderate nonproliferative diabetic retinopathy without macular edema, bilateral: Secondary | ICD-10-CM | POA: Diagnosis not present

## 2023-01-06 DIAGNOSIS — Z794 Long term (current) use of insulin: Secondary | ICD-10-CM | POA: Diagnosis not present

## 2023-01-06 DIAGNOSIS — N1831 Chronic kidney disease, stage 3a: Secondary | ICD-10-CM | POA: Diagnosis not present

## 2023-01-06 DIAGNOSIS — E669 Obesity, unspecified: Secondary | ICD-10-CM | POA: Diagnosis not present

## 2023-01-06 DIAGNOSIS — I129 Hypertensive chronic kidney disease with stage 1 through stage 4 chronic kidney disease, or unspecified chronic kidney disease: Secondary | ICD-10-CM | POA: Diagnosis not present

## 2023-01-06 DIAGNOSIS — E1129 Type 2 diabetes mellitus with other diabetic kidney complication: Secondary | ICD-10-CM | POA: Diagnosis not present

## 2023-01-17 DIAGNOSIS — E1129 Type 2 diabetes mellitus with other diabetic kidney complication: Secondary | ICD-10-CM | POA: Diagnosis not present

## 2023-04-09 DIAGNOSIS — I129 Hypertensive chronic kidney disease with stage 1 through stage 4 chronic kidney disease, or unspecified chronic kidney disease: Secondary | ICD-10-CM | POA: Diagnosis not present

## 2023-04-09 DIAGNOSIS — N1831 Chronic kidney disease, stage 3a: Secondary | ICD-10-CM | POA: Diagnosis not present

## 2023-04-09 DIAGNOSIS — E1129 Type 2 diabetes mellitus with other diabetic kidney complication: Secondary | ICD-10-CM | POA: Diagnosis not present

## 2023-04-09 DIAGNOSIS — Z794 Long term (current) use of insulin: Secondary | ICD-10-CM | POA: Diagnosis not present

## 2023-04-09 DIAGNOSIS — E669 Obesity, unspecified: Secondary | ICD-10-CM | POA: Diagnosis not present

## 2023-04-24 ENCOUNTER — Encounter: Payer: Self-pay | Admitting: Internal Medicine

## 2023-04-24 DIAGNOSIS — N1831 Chronic kidney disease, stage 3a: Secondary | ICD-10-CM | POA: Diagnosis not present

## 2023-04-24 DIAGNOSIS — I129 Hypertensive chronic kidney disease with stage 1 through stage 4 chronic kidney disease, or unspecified chronic kidney disease: Secondary | ICD-10-CM | POA: Diagnosis not present

## 2023-04-28 DIAGNOSIS — I129 Hypertensive chronic kidney disease with stage 1 through stage 4 chronic kidney disease, or unspecified chronic kidney disease: Secondary | ICD-10-CM | POA: Diagnosis not present

## 2023-04-28 DIAGNOSIS — N1831 Chronic kidney disease, stage 3a: Secondary | ICD-10-CM | POA: Diagnosis not present

## 2023-05-23 DIAGNOSIS — R202 Paresthesia of skin: Secondary | ICD-10-CM | POA: Diagnosis not present

## 2023-05-23 DIAGNOSIS — L578 Other skin changes due to chronic exposure to nonionizing radiation: Secondary | ICD-10-CM | POA: Diagnosis not present

## 2023-05-23 DIAGNOSIS — L570 Actinic keratosis: Secondary | ICD-10-CM | POA: Diagnosis not present

## 2023-05-23 DIAGNOSIS — D1801 Hemangioma of skin and subcutaneous tissue: Secondary | ICD-10-CM | POA: Diagnosis not present

## 2023-05-23 DIAGNOSIS — D485 Neoplasm of uncertain behavior of skin: Secondary | ICD-10-CM | POA: Diagnosis not present

## 2023-05-23 DIAGNOSIS — L814 Other melanin hyperpigmentation: Secondary | ICD-10-CM | POA: Diagnosis not present

## 2023-05-23 DIAGNOSIS — L738 Other specified follicular disorders: Secondary | ICD-10-CM | POA: Diagnosis not present

## 2023-05-23 DIAGNOSIS — L821 Other seborrheic keratosis: Secondary | ICD-10-CM | POA: Diagnosis not present

## 2023-05-27 ENCOUNTER — Encounter: Payer: Self-pay | Admitting: Internal Medicine

## 2023-05-28 DIAGNOSIS — E1129 Type 2 diabetes mellitus with other diabetic kidney complication: Secondary | ICD-10-CM | POA: Diagnosis not present

## 2023-06-25 ENCOUNTER — Encounter: Payer: Self-pay | Admitting: Internal Medicine

## 2023-06-25 ENCOUNTER — Telehealth: Payer: Self-pay | Admitting: *Deleted

## 2023-06-25 ENCOUNTER — Ambulatory Visit (AMBULATORY_SURGERY_CENTER): Payer: Medicare PPO | Admitting: *Deleted

## 2023-06-25 VITALS — Ht 60.0 in | Wt 195.0 lb

## 2023-06-25 DIAGNOSIS — Z86010 Personal history of colonic polyps: Secondary | ICD-10-CM

## 2023-06-25 MED ORDER — NA SULFATE-K SULFATE-MG SULF 17.5-3.13-1.6 GM/177ML PO SOLN: 1.0000 | Freq: Once | ORAL | 0 refills | Status: AC

## 2023-06-25 NOTE — Progress Notes (Signed)
Pt's name and DOB verified at the beginning of the pre-visit.  Pt denies any difficulty with ambulating,sitting, laying down or rolling side to side Gave both [facility] main # and MD on call # prior to instructions.  No egg or soy allergy known to patient  No issues known to pt with past sedation with any surgeries or procedures Pt denies having issues being intubated Pt has no issues moving head neck or swallowing No FH of Malignant Hyperthermia Pt is not on diet pills Pt is not on home 02  Pt is not on blood thinners  Pt denies issues with constipation  Pt is not on dialysis Pt denise any abnormal heart rhythms  Pt denies any upcoming cardiac testing Pt encouraged to use to use Singlecare or Goodrx to reduce cost  Patient's chart reviewed by Cathlyn Parsons CNRA prior to pre-visit and patient appropriate for the [facility].  Pre-visit completed and red dot placed by patient's name on their procedure day (on provider's schedule).  . Visit by phone Pt states weight is 195 lb Instructed pt why it is important to and  to call if they have any changes in health or new medications. Directed them to the # given and on instructions.   Pt states they will.  Instructions reviewed with pt and pt states understanding. Instructed to review again prior to procedure. Pt states they will.  Instructions sent by mail with coupon and by my chart Pt stated she has a insulin pump and with no instructions from MD

## 2023-06-25 NOTE — Telephone Encounter (Signed)
Left message for pt to call back to provide [place] the name of the provider that manages her insulin pump. Let her know we will need to get instructions as to how they want her insulin to go the day of the procedure.

## 2023-06-26 NOTE — Telephone Encounter (Signed)
Letter faxed to Dr. Lanell Matar office requesting instructions regarding pts insulin pump for upcoming colonoscopy.

## 2023-07-07 NOTE — Telephone Encounter (Signed)
Called and left message for Dr. Lanell Matar CMA regarding insulin pump instructions.

## 2023-07-08 NOTE — Telephone Encounter (Signed)
Received communication from Dr. Lanell Matar office regarding pts insulin pump. Per Dr. Jacky Kindle pt needs no bolus and is to let the basal rate run. Spoke with pt and she is aware.

## 2023-07-15 ENCOUNTER — Encounter: Payer: Self-pay | Admitting: Internal Medicine

## 2023-07-15 ENCOUNTER — Ambulatory Visit (AMBULATORY_SURGERY_CENTER): Payer: Medicare PPO | Admitting: Internal Medicine

## 2023-07-15 VITALS — BP 146/82 | HR 80 | Temp 97.3°F | Resp 16 | Ht 60.0 in | Wt 195.0 lb

## 2023-07-15 DIAGNOSIS — E119 Type 2 diabetes mellitus without complications: Secondary | ICD-10-CM | POA: Diagnosis not present

## 2023-07-15 DIAGNOSIS — R197 Diarrhea, unspecified: Secondary | ICD-10-CM | POA: Diagnosis not present

## 2023-07-15 DIAGNOSIS — K529 Noninfective gastroenteritis and colitis, unspecified: Secondary | ICD-10-CM

## 2023-07-15 DIAGNOSIS — Z1211 Encounter for screening for malignant neoplasm of colon: Secondary | ICD-10-CM | POA: Diagnosis not present

## 2023-07-15 DIAGNOSIS — I10 Essential (primary) hypertension: Secondary | ICD-10-CM | POA: Diagnosis not present

## 2023-07-15 DIAGNOSIS — Z86010 Personal history of colonic polyps: Secondary | ICD-10-CM | POA: Diagnosis not present

## 2023-07-15 DIAGNOSIS — E785 Hyperlipidemia, unspecified: Secondary | ICD-10-CM | POA: Diagnosis not present

## 2023-07-15 MED ORDER — SODIUM CHLORIDE 0.9 % IV SOLN: 500.0000 mL | Freq: Once | INTRAVENOUS | Status: DC

## 2023-07-15 NOTE — Progress Notes (Signed)
HISTORY OF PRESENT ILLNESS:  Cindy Lester is a 77 y.o. female presents today for routine screening colonoscopy.  Previous examinations 2004, 2009, 2014 were negative for neoplasia.  REVIEW OF SYSTEMS:  All non-GI ROS negative. Past Medical History:  Diagnosis Date   Cataract    Colon polyps    Diabetes mellitus    Diverticulosis    GERD (gastroesophageal reflux disease)    occ-no meds   Hyperlipidemia    Hypertension    Neuropathy    Nonproliferative diabetic retinopathy of both eyes (HCC) 10/31/2020   Osteoarthritis    Tingling    neck   Wears glasses     Past Surgical History:  Procedure Laterality Date   ABDOMINAL HYSTERECTOMY  11/11/1985   ACDF     2021   COLONOSCOPY     GANGLION CYST EXCISION Right 03/17/2013   Procedure: EXCISION MASS RIGHT WRIST;  Surgeon: Nicki Reaper, MD;  Location: [facility];  Service: Orthopedics;  Laterality: Right;   HAND SURGERY Right    right-as child    Social History COURNEY ROSENKRANTZ  reports that she has never smoked. She has never used smokeless tobacco. She reports current alcohol use. She reports that she does not use drugs.  family history includes Alcoholism in her mother; Bladder Cancer in her father; Coronary artery disease in her father; Melanoma in her father.  Allergies  Allergen Reactions   Codeine Nausea And Vomiting   Hydrocodone Other (See Comments)    Dizzy   Oxycodone Other (See Comments)    Dizzy   Penicillins    Tape Other (See Comments)    Burns skin       PHYSICAL EXAMINATION: Vital signs: BP (!) 147/68   Pulse 68   Temp (!) 97.3 F (36.3 C)   Ht 5' (1.524 m)   Wt 195 lb (88.5 kg)   SpO2 99%   BMI 38.08 kg/m  General: Well-developed, well-nourished, no acute distress HEENT: Sclerae are anicteric, conjunctiva pink. Oral mucosa intact Lungs: Clear Heart: Regular Abdomen: soft, nontender, nondistended, no obvious ascites, no peritoneal signs, normal bowel sounds. No  organomegaly. Extremities: No edema Psychiatric: alert and oriented x3. Cooperative     ASSESSMENT:   Colon cancer screening  PLAN:   Screening colonoscopy

## 2023-07-15 NOTE — Progress Notes (Signed)
Called to room to assist during endoscopic procedure.  Patient ID and intended procedure confirmed with present staff. Received instructions for my participation in the procedure from the performing physician.  

## 2023-07-15 NOTE — Progress Notes (Signed)
Pt's states no medical or surgical changes since previsit or office visit. 

## 2023-07-15 NOTE — Patient Instructions (Signed)
-  Handout on diverticulosis provided -await pathology results -repeat colonoscopy is not recommended for surveillance. -Continue present medications    YOU HAD AN ENDOSCOPIC PROCEDURE TODAY AT THE [facility]:   Refer to the procedure report that was given to you for any specific questions about what was found during the examination.  If the procedure report does not answer your questions, please call your gastroenterologist to clarify.  If you requested that your care partner not be given the details of your procedure findings, then the procedure report has been included in a sealed envelope for you to review at your convenience later.  YOU SHOULD EXPECT: Some feelings of bloating in the abdomen. Passage of more gas than usual.  Walking can help get rid of the air that was put into your GI tract during the procedure and reduce the bloating. If you had a lower endoscopy (such as a colonoscopy or flexible sigmoidoscopy) you may notice spotting of blood in your stool or on the toilet paper. If you underwent a bowel prep for your procedure, you may not have a normal bowel movement for a few days.  Please Note:  You might notice some irritation and congestion in your nose or some drainage.  This is from the oxygen used during your procedure.  There is no need for concern and it should clear up in a day or so.  SYMPTOMS TO REPORT IMMEDIATELY:  Following lower endoscopy (colonoscopy or flexible sigmoidoscopy):  Excessive amounts of blood in the stool  Significant tenderness or worsening of abdominal pains  Swelling of the abdomen that is new, acute  Fever of 100F or higher   For urgent or emergent issues, a gastroenterologist can be reached at any hour by calling [phone]. Do not use MyChart messaging for urgent concerns.    DIET:  We do recommend a small meal at first, but then you may proceed to your regular diet.  Drink plenty of fluids but you should avoid alcoholic  beverages for 24 hours.  ACTIVITY:  You should plan to take it easy for the rest of today and you should NOT DRIVE or use heavy machinery until tomorrow (because of the sedation medicines used during the test).    FOLLOW UP: Our staff will call the number listed on your records the next business day following your procedure.  We will call around 7:15- 8:00 am to check on you and address any questions or concerns that you may have regarding the information given to you following your procedure. If we do not reach you, we will leave a message.     If any biopsies were taken you will be contacted by phone or by letter within the next 1-3 weeks.  Please call us at [phone] if you have not heard about the biopsies in 3 weeks.    SIGNATURES/CONFIDENTIALITY: You and/or your care partner have signed paperwork which will be entered into your electronic medical record.  These signatures attest to the fact that that the information above on your After Visit Summary has been reviewed and is understood.  Full responsibility of the confidentiality of this discharge information lies with you and/or your care-partner.

## 2023-07-15 NOTE — Progress Notes (Signed)
Sedate, gd SR, tolerated procedure well, VSS, report to RN 

## 2023-07-15 NOTE — Op Note (Addendum)
[facility] Patient Name: Cindy Lester Procedure Date: 07/15/2023 10:35 AM MRN: [id number] Endoscopist: Wilhemina Bonito. Marina Goodell , MD, [id number] Age: 77 Referring MD:  Date of Birth: [birth year] Gender: Female Account #: [id number] Procedure:                Colonoscopy with biopsies Indications:              Screening for colorectal malignant neoplasm,                            Incidental diarrhea noted Medicines:                Monitored Anesthesia Care Procedure:                Pre-Anesthesia Assessment:                           - Prior to the procedure, a History and Physical                            was performed, and patient medications and                            allergies were reviewed. The patient's tolerance of                            previous anesthesia was also reviewed. The risks                            and benefits of the procedure and the sedation                            options and risks were discussed with the patient.                            All questions were answered, and informed consent                            was obtained. Prior Anticoagulants: The patient has                            taken no anticoagulant or antiplatelet agents. ASA                            Grade Assessment: II - A patient with mild systemic                            disease. After reviewing the risks and benefits,                            the patient was deemed in satisfactory condition to                            undergo the procedure.  After obtaining informed consent, the colonoscope                            was passed under direct vision. Throughout the                            procedure, the patient's blood pressure, pulse, and                            oxygen saturations were monitored continuously. The                            CF [id number] #[id number] was introduced through the anus                            and advanced to the  the cecum, identified by                            appendiceal orifice and ileocecal valve. The                            ileocecal valve, appendiceal orifice, and rectum                            were photographed. The quality of the bowel                            preparation was good. The colonoscopy was performed                            without difficulty. The patient tolerated the                            procedure well. The bowel preparation used was                            SUPREP via split dose instruction. Scope In: 10:51:42 AM Scope Out: 11:03:06 AM Scope Withdrawal Time: 0 hours 8 minutes 25 seconds  Total Procedure Duration: 0 hours 11 minutes 24 seconds  Findings:                 Multiple diverticula were found in the left colon                            and right colon.                           The exam was otherwise without abnormality on                            direct and retroflexion views.                           Biopsies for histology were taken with a cold  forceps from the entire colon for evaluation of                            microscopic colitis. Complications:            No immediate complications. Estimated blood loss:                            None. Estimated Blood Loss:     Estimated blood loss: none. Impression:               - Diverticulosis in the left colon and in the right                            colon.                           - The examination was otherwise normal on direct                            and retroflexion views.                           - Biopsies were taken with a cold forceps from the                            entire colon for evaluation of microscopic colitis. Recommendation:           - Repeat colonoscopy is not recommended for                            surveillance.                           - Patient has a contact number available for                            emergencies. The  signs and symptoms of potential                            delayed complications were discussed with the                            patient. Return to normal activities tomorrow.                            Written discharge instructions were provided to the                            patient.                           - Resume previous diet.                           - Continue present medications.                           -  Await pathology results.                           (Could consiser Colestid tabs 1-2 Gm PO twice daily                            in place of cholestyramine powder) Eliberto Sole N. Marina Goodell, MD 07/15/2023 11:12:36 AM This report has been signed electronically.

## 2023-07-16 ENCOUNTER — Telehealth: Payer: Self-pay

## 2023-07-16 NOTE — Telephone Encounter (Signed)
  Follow up Call-     07/15/2023   10:01 AM  Call back number  Post procedure Call Back phone  # [phone]  Permission to leave phone message Yes     Patient questions:  Do you have a fever, pain , or abdominal swelling? No. Pain Score  0 *  Have you tolerated food without any problems? Yes.    Have you been able to return to your normal activities? Yes.    Do you have any questions about your discharge instructions: Diet   No. Medications  No. Follow up visit  No.  Do you have questions or concerns about your Care? No.  Actions: * If pain score is 4 or above: No action needed, pain <4.

## 2023-07-17 ENCOUNTER — Encounter: Payer: Self-pay | Admitting: Internal Medicine

## 2023-08-04 DIAGNOSIS — E785 Hyperlipidemia, unspecified: Secondary | ICD-10-CM | POA: Diagnosis not present

## 2023-08-04 DIAGNOSIS — E1129 Type 2 diabetes mellitus with other diabetic kidney complication: Secondary | ICD-10-CM | POA: Diagnosis not present

## 2023-08-04 DIAGNOSIS — E669 Obesity, unspecified: Secondary | ICD-10-CM | POA: Diagnosis not present

## 2023-08-04 DIAGNOSIS — I129 Hypertensive chronic kidney disease with stage 1 through stage 4 chronic kidney disease, or unspecified chronic kidney disease: Secondary | ICD-10-CM | POA: Diagnosis not present

## 2023-08-04 DIAGNOSIS — N1831 Chronic kidney disease, stage 3a: Secondary | ICD-10-CM | POA: Diagnosis not present

## 2023-08-04 DIAGNOSIS — Z794 Long term (current) use of insulin: Secondary | ICD-10-CM | POA: Diagnosis not present

## 2023-09-10 ENCOUNTER — Ambulatory Visit: Payer: Medicare PPO | Admitting: Student

## 2023-09-10 ENCOUNTER — Encounter: Payer: Self-pay | Admitting: Student

## 2023-09-10 VITALS — BP 144/76 | HR 71 | Temp 98.0°F | Ht 60.0 in | Wt 196.0 lb

## 2023-09-10 DIAGNOSIS — R202 Paresthesia of skin: Secondary | ICD-10-CM | POA: Insufficient documentation

## 2023-09-10 DIAGNOSIS — G250 Essential tremor: Secondary | ICD-10-CM

## 2023-09-10 DIAGNOSIS — I10 Essential (primary) hypertension: Secondary | ICD-10-CM

## 2023-09-10 DIAGNOSIS — Z794 Long term (current) use of insulin: Secondary | ICD-10-CM | POA: Diagnosis not present

## 2023-09-10 DIAGNOSIS — L400 Psoriasis vulgaris: Secondary | ICD-10-CM

## 2023-09-10 DIAGNOSIS — H43813 Vitreous degeneration, bilateral: Secondary | ICD-10-CM

## 2023-09-10 DIAGNOSIS — I129 Hypertensive chronic kidney disease with stage 1 through stage 4 chronic kidney disease, or unspecified chronic kidney disease: Secondary | ICD-10-CM | POA: Diagnosis not present

## 2023-09-10 DIAGNOSIS — Z1159 Encounter for screening for other viral diseases: Secondary | ICD-10-CM

## 2023-09-10 DIAGNOSIS — H35043 Retinal micro-aneurysms, unspecified, bilateral: Secondary | ICD-10-CM

## 2023-09-10 DIAGNOSIS — M1710 Unilateral primary osteoarthritis, unspecified knee: Secondary | ICD-10-CM

## 2023-09-10 DIAGNOSIS — K909 Intestinal malabsorption, unspecified: Secondary | ICD-10-CM

## 2023-09-10 DIAGNOSIS — R197 Diarrhea, unspecified: Secondary | ICD-10-CM

## 2023-09-10 DIAGNOSIS — E539 Vitamin B deficiency, unspecified: Secondary | ICD-10-CM

## 2023-09-10 DIAGNOSIS — E2839 Other primary ovarian failure: Secondary | ICD-10-CM

## 2023-09-10 DIAGNOSIS — Z6838 Body mass index (BMI) 38.0-38.9, adult: Secondary | ICD-10-CM

## 2023-09-10 DIAGNOSIS — K5730 Diverticulosis of large intestine without perforation or abscess without bleeding: Secondary | ICD-10-CM | POA: Diagnosis not present

## 2023-09-10 DIAGNOSIS — E113393 Type 2 diabetes mellitus with moderate nonproliferative diabetic retinopathy without macular edema, bilateral: Secondary | ICD-10-CM | POA: Diagnosis not present

## 2023-09-10 DIAGNOSIS — M47812 Spondylosis without myelopathy or radiculopathy, cervical region: Secondary | ICD-10-CM | POA: Insufficient documentation

## 2023-09-10 DIAGNOSIS — E66812 Obesity, class 2: Secondary | ICD-10-CM

## 2023-09-10 DIAGNOSIS — L821 Other seborrheic keratosis: Secondary | ICD-10-CM | POA: Insufficient documentation

## 2023-09-10 MED ORDER — CARVEDILOL 12.5 MG PO TABS: 12.5000 mg | ORAL_TABLET | Freq: Two times a day (BID) | ORAL | 6 refills | Status: DC

## 2023-09-10 MED ORDER — ATORVASTATIN CALCIUM 40 MG PO TABS: 40.0000 mg | ORAL_TABLET | Freq: Every day | ORAL | 3 refills | Status: DC

## 2023-09-10 MED ORDER — VALSARTAN 320 MG PO TABS: 320.0000 mg | ORAL_TABLET | Freq: Every day | ORAL | 3 refills | Status: DC

## 2023-09-10 MED ORDER — FUROSEMIDE 40 MG PO TABS: 40.0000 mg | ORAL_TABLET | Freq: Every day | ORAL | 3 refills | Status: DC

## 2023-09-10 MED ORDER — ETODOLAC 400 MG PO TABS: 400.0000 mg | ORAL_TABLET | Freq: Every day | ORAL | 2 refills | Status: DC

## 2023-09-10 MED ORDER — CANAGLIFLOZIN 100 MG PO TABS: 100.0000 mg | ORAL_TABLET | Freq: Every day | ORAL | 3 refills | Status: DC

## 2023-09-10 MED ORDER — INSULIN LISPRO 100 UNIT/ML IJ SOLN: 40.0000 [IU] | Freq: Every day | INTRAMUSCULAR | 3 refills | Status: DC

## 2023-09-10 MED ORDER — AMLODIPINE BESYLATE 10 MG PO TABS: 10.0000 mg | ORAL_TABLET | Freq: Every day | ORAL | 3 refills | Status: DC

## 2023-09-10 MED ORDER — CHOLESTYRAMINE 4 G PO PACK: 4.0000 g | PACK | Freq: Every day | ORAL | 3 refills | Status: DC

## 2023-09-10 NOTE — Patient Instructions (Addendum)
Please come to the clinic at 7:30 AM tomorrow to have your labs collected. We will collect Urine at that time.  For your bolus, consider adjusting your lunch time dosing to 1:40 for your meal time insulin.   I recommend 6-8 glasses of water per day  STOP metoprolol. Start Carvedilol 12.5 mg AM and PM

## 2023-09-10 NOTE — Progress Notes (Unsigned)
Location:  [facility].   Provider: Dr. Earnestine Mealing  Code Status: DNR Goals of Care:     09/10/2023    1:18 PM  Advanced Directives  Does Patient Have a Medical Advance Directive? Yes  Type of Estate agent of [place];Out of facility DNR (pink MOST or yellow form);Living will  Does patient want to make changes to medical advance directive? No - Patient declined  Copy of Healthcare Power of Attorney in Chart? No - copy requested     Chief Complaint  Patient presents with   Establish Care    Establish Care.    Quality Metric Gaps    To discuss need for Zoster, Diabetic Kidney Eval, Eye, A1C, AWV, Foot Exam, Hep C Screening and Dexa.     HPI: Patient is a 77 y.o. female seen today to Establish  Concerns for today: Hypertension: still elevated even though she is on 4 blood pressure medications. She checks her blood pressure at home periodically in the 130s, but rarely in the 120s. Typically in the She has had hydrochlorothiazide before. She is on amlodipine. Valsartan 320 recently increased dosing  Tremor - it feels for her internal but sometimes my hands shakes. She can put her hand down and it stops and thought it. Doesn't feel that serious. Sometimes it's internal and other times she notices her head nodding.   Chronic Conditions  Cervical spine arthritis s/p ACDF  4-6 surgery of the cervical spine. That was making her hands and arm going to sleep. It wasn't her primary provider. The symptoms are coming back. Sometimes can release it with certain neck movements. Her arm went to sleep multple  She plans to call spinal surgery again. She could be out for 10 weeks with the surgery.  Diverticulosis - Last colonoscopy a couple of months ago. LAST ONE!  Moderate diabetic retinopathy - Dr. Luciana Axe does her vision checks.  Hx of cataract surgery on both eyes  Psoriasis - rash of the hands chest and face a few years ago. She went to dermatologist and  was put on a medication (injection) and it cleared up. Some residual flakes that are left over. Just a couple of months.  Diabetes: Has been managed by endocrinologist for 25 years. She sees him 4x a year. Most recent A1c 6 per patient report. Some low glucoses after lunch.she has a basal rate of her insulin - average basal is 40 units and she boluses based on carbs in 30:1 carb ratio with meals. She has a number of low levels. She can usually feel the high levels and the low levels, but no 100% of the time. Her husband cna feel her skin and help with it as well. Generally has a low every 1-2 weeks. She takes sugar pill and or lemonade.  Ganglion cyst of right wrist - removed.   Diarrhea - chronic for 5 years or so. Takes Questran and that's the only thing that works. She takes it 5-6 days a week.  Arthritis - Etodolac it is important for pain. No signs of bleeding. She needs something for   Mobility - she does yoga 3x weekly. She plans to start line dancing and potentially increase her yoga activity. Helps with pain management for her. No falls this year. No assistive devices  Mentation - Mood no depression or anxiety, never had an issues. No concerns with her memories. SLUMS 30/30  Surgery hyxterectomy at 40 and hand surgery as a kid.  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 Second Marriage. Hysterectomy due to endometriosis. She is originally from [place]. Moved on campus 1 week ago. She avoids driving at night because of DR.  Past Medical History:  Diagnosis Date   Cataract    Colon polyps    Diabetes mellitus    Diverticulosis    GERD (gastroesophageal reflux disease)    occ-no meds   Hyperlipidemia    Hypertension    Neuropathy    Nonproliferative diabetic retinopathy of both eyes (HCC) 10/31/2020   Osteoarthritis    Tingling    neck   Wears glasses     Past Surgical History:  Procedure Laterality Date   ABDOMINAL HYSTERECTOMY   11/11/1985   ACDF     2021   COLONOSCOPY     GANGLION CYST EXCISION Right 03/17/2013   Procedure: EXCISION MASS RIGHT WRIST;  Surgeon: Nicki Reaper, MD;  Location: [facility];  Service: Orthopedics;  Laterality: Right;   HAND SURGERY Right    right-as child    Allergies  Allergen Reactions   Codeine Nausea And Vomiting   Hydrocodone Other (See Comments)    Dizzy   Hydrocodone-Acetaminophen Itching   Oxycodone Other (See Comments)    Dizzy   Penicillin G     Other Reaction(s): Unknown   Penicillins    Tape Other (See Comments)    Burns skin   Zoster Vaccine Live Other (See Comments)   Amoxicillin Rash    Outpatient Encounter Medications as of 09/10/2023  Medication Sig   B Complex Vitamins (B COMPLEX 50 PO) Take 1 tablet by mouth daily.   Blood Glucose Calibration (ACCU-CHEK AVIVA VI) Use as Directed. Dx: E11.9   carvedilol (COREG) 12.5 MG tablet Take 1 tablet (12.5 mg total) by mouth 2 (two) times daily with a meal.   Cholecalciferol (VITAMIN D3 PO) Take 2 capsules by mouth daily.   CINNAMON PO Take by mouth daily.   co-enzyme Q-10 50 MG capsule Take 100 mg by mouth daily.   fish oil-omega-3 fatty acids 1000 MG capsule Take 2 g by mouth daily.   glucose blood (ACCU-CHEK AVIVA PLUS) test strip    MAGNESIUM-ZINC PO Take by mouth daily.   Multiple Vitamin (MULTIVITAMIN) capsule Take 1 capsule by mouth daily.     vitamin B-12 (CYANOCOBALAMIN) 1000 MCG tablet Take 1,000 mcg by mouth daily.   [DISCONTINUED] amLODipine (NORVASC) 10 MG tablet Take 10 mg by mouth daily.   [DISCONTINUED] atorvastatin (LIPITOR) 40 MG tablet Take 40 mg by mouth daily.    [DISCONTINUED] Canagliflozin (INVOKANA) 100 MG TABS Take 100 mg by mouth daily before breakfast.   [DISCONTINUED] cholestyramine (QUESTRAN) 4 g packet Take 4 g by mouth daily.   [DISCONTINUED] etodolac (LODINE) 400 MG tablet Take 400 mg by mouth daily.   [DISCONTINUED] furosemide (LASIX) 40 MG tablet Take 40 mg by  mouth daily.    [DISCONTINUED] insulin lispro (HUMALOG) 100 UNIT/ML injection Inject 40 Units into the skin daily.   [DISCONTINUED] metoprolol succinate (TOPROL-XL) 50 MG 24 hr tablet Take 50 mg by mouth daily. Take with or immediately following a meal.   [DISCONTINUED] valsartan (DIOVAN) 320 MG tablet Take 320 mg by mouth daily.   amLODipine (NORVASC) 10 MG tablet Take 1 tablet (10 mg total) by mouth daily.   atorvastatin (LIPITOR) 40 MG tablet Take 1 tablet (40 mg total) by mouth daily.   canagliflozin (INVOKANA) 100 MG TABS tablet Take  1 tablet (100 mg total) by mouth daily before breakfast.   cholestyramine (QUESTRAN) 4 g packet Take 1 packet (4 g total) by mouth daily.   etodolac (LODINE) 400 MG tablet Take 1 tablet (400 mg total) by mouth daily.   furosemide (LASIX) 40 MG tablet Take 1 tablet (40 mg total) by mouth daily.   insulin lispro (HUMALOG) 100 UNIT/ML injection Inject 0.4 mLs (40 Units total) into the skin daily.   valsartan (DIOVAN) 320 MG tablet Take 1 tablet (320 mg total) by mouth daily.   [DISCONTINUED] aspirin 81 MG tablet Take 81 mg by mouth daily.     [DISCONTINUED] calcium-vitamin D (OSCAL WITH D) 500-200 MG-UNIT per tablet Take 1 tablet by mouth daily.   [DISCONTINUED] gabapentin (NEURONTIN) 100 MG capsule Take 1 capsule (100 mg total) by mouth at bedtime.   [DISCONTINUED] glucosamine-chondroitin 500-400 MG tablet Take 1 tablet by mouth 3 (three) times daily.   [DISCONTINUED] insulin regular (NOVOLIN R,HUMULIN R) 100 units/mL injection Inject into the skin as directed.   [DISCONTINUED] metFORMIN (GLUMETZA) 500 MG (MOD) 24 hr tablet Take 500 mg by mouth daily with breakfast.  (Patient not taking: Reported on 07/15/2023)   [DISCONTINUED] OVER THE COUNTER MEDICATION Cholesterol suppliment   [DISCONTINUED] Resveratrol 50 MG CAPS Take by mouth.   No facility-administered encounter medications on file as of 09/10/2023.    Review of Systems:  Review of Systems  Health  Maintenance  Topic Date Due   HEMOGLOBIN A1C  Never done   Medicare Annual Wellness (AWV)  Never done   Diabetic kidney evaluation - Urine ACR  Never done   Zoster Vaccines- Shingrix (1 of 2) 01/01/1965   DEXA SCAN  Never done   OPHTHALMOLOGY EXAM  11/13/2022   COVID-19 Vaccine (9 - 2023-24 season) 09/15/2023   FOOT EXAM  09/09/2024   Diabetic kidney evaluation - eGFR measurement  09/10/2024   DTaP/Tdap/Td (2 - Td or Tdap) 08/30/2030   Pneumonia Vaccine 32+ Years old  Completed   INFLUENZA VACCINE  Completed   Hepatitis C Screening  Completed   HPV VACCINES  Aged Out   Colonoscopy  Discontinued    Physical Exam: Vitals:   09/10/23 1259 09/10/23 1319  BP: (!) 146/82 (!) 144/76  Pulse: 71   Temp: 98 F (36.7 C)   SpO2: 98%   Weight: 196 lb (88.9 kg)   Height: 5' (1.524 m)    Body mass index is 38.28 kg/m. Physical Exam Constitutional:      Appearance: Normal appearance.  HENT:     Right Ear: Tympanic membrane normal.     Left Ear: Tympanic membrane normal.     Mouth/Throat:     Mouth: Mucous membranes are moist.     Pharynx: No oropharyngeal exudate or posterior oropharyngeal erythema.  Eyes:     Extraocular Movements: Extraocular movements intact.     Pupils: Pupils are equal, round, and reactive to light.  Cardiovascular:     Rate and Rhythm: Normal rate.     Pulses: Normal pulses.     Heart sounds: Normal heart sounds.  Pulmonary:     Effort: Pulmonary effort is normal.     Breath sounds: Normal breath sounds.  Abdominal:     General: Abdomen is flat.     Palpations: Abdomen is soft.  Skin:    General: Skin is warm and dry.  Neurological:     Mental Status: She is alert and oriented to person, place, and time.  Psychiatric:  Mood and Affect: Mood normal.     Labs reviewed: Basic Metabolic Panel: Recent Labs    09/11/23 0740  NA 138  K 4.3  CL 102  CO2 30  GLUCOSE 100*  BUN 22  CREATININE 1.07*  CALCIUM 8.8  8.8  TSH 1.83   Liver  Function Tests: Recent Labs    09/11/23 0740  AST 20  ALT 18  BILITOT 0.8  PROT 6.8   No results for input(s): "LIPASE", "AMYLASE" in the last 8760 hours. No results for input(s): "AMMONIA" in the last 8760 hours. CBC: Recent Labs    09/11/23 0740  WBC 5.8  NEUTROABS 3,277  HGB 13.0  HCT 41.9  MCV 92.1  PLT 299   Lipid Panel: No results for input(s): "CHOL", "HDL", "LDLCALC", "TRIG", "CHOLHDL", "LDLDIRECT" in the last 8760 hours. No results found for: "HGBA1C"  Procedures since last visit: No results found.  Assessment/Plan Essential hypertension - Plan: Vitamin B12, carvedilol (COREG) 12.5 MG tablet, valsartan (DIOVAN) 320 MG tablet, furosemide (LASIX) 40 MG tablet, atorvastatin (LIPITOR) 40 MG tablet, amLODipine (NORVASC) 10 MG tablet  Retinal microaneurysm of both eyes  DIVERTICULOSIS, COLON  Moderate nonproliferative diabetic retinopathy of both eyes without macular edema associated with type 2 diabetes mellitus (HCC) - Plan: Complete Metabolic Panel with eGFR, Hemoglobin A1c, Microalbumin/Creatinine Ratio, Urine, insulin lispro (HUMALOG) 100 UNIT/ML injection, canagliflozin (INVOKANA) 100 MG TABS tablet  Essential tremor  Plaque psoriasis  Primary osteoarthritis of knee, unspecified laterality  Chronic kidney disease due to hypertension - Plan: T4 AND TSH, VITAMIN D 25 Hydroxy (Vit-D Deficiency, Fractures), PTH, Intact and Calcium, CBC With Differential/Platelet  Long term (current) use of insulin (HCC)  Posterior vitreous detachment of both eyes  Class 2 severe obesity due to excess calories with serious comorbidity and body mass index (BMI) of 38.0 to 38.9 in adult [facility])  Estrogen deficiency - Plan: DG BONE DENSITY (DXA)  Need for hepatitis C screening test - Plan: Hep C Antibody  Diarrhea due to malabsorption - Plan: cholestyramine (QUESTRAN) 4 g packet Patient with history of hypertension, poorly controlled.  Discussed transition to carvedilol for  metoprolol for improvement of blood pressure management.  Follow closely with ophthalmology for retinopathy.  History of diverticulosis, no diverticulitis in the past.  Normal bowel movements and regular.  Continue to monitor.  Essential tremor without treatment at this time, discussed beta-blocker declined.  History of psoriasis however no recurrence after initial treatment.  Primary osteoarthritis of the knees treated with NSAID.  Discussed concern for use of NSAID and risk of bleeding, continue at this time.  Declined PPI.  History of CKD will collect labs to evaluate current status.  Continue ARB.  History of floaters, no concerns at this time.  Continue to monitor.  Discussed patient's hypoglycemic events after lunch, adjust carb counting 1-40 with meals to prevent low glucoses.  Continue basal rate 35-40.  Patient exercises 3 times weekly planning to increase with recent move, encouraged.  DEXA scan ordered as it has been greater than 5 years since evaluation for osteoporosis.  Long standing history of noninfectious diarrhea.  Recent colonoscopy negative for findings.  Continue Questran.  Follow-up in 2 weeks for blood pressure management.  Labs/tests ordered:  * No order type specified * Next appt:  Visit date not found  I spent greater than 60 minutes for the care of this patient in face to face time, chart review, clinical documentation, patient education.

## 2023-09-11 ENCOUNTER — Other Ambulatory Visit: Payer: Self-pay | Admitting: Student

## 2023-09-11 DIAGNOSIS — E113393 Type 2 diabetes mellitus with moderate nonproliferative diabetic retinopathy without macular edema, bilateral: Secondary | ICD-10-CM | POA: Diagnosis not present

## 2023-09-11 DIAGNOSIS — I10 Essential (primary) hypertension: Secondary | ICD-10-CM | POA: Diagnosis not present

## 2023-09-11 DIAGNOSIS — E569 Vitamin deficiency, unspecified: Secondary | ICD-10-CM | POA: Diagnosis not present

## 2023-09-11 DIAGNOSIS — I129 Hypertensive chronic kidney disease with stage 1 through stage 4 chronic kidney disease, or unspecified chronic kidney disease: Secondary | ICD-10-CM | POA: Diagnosis not present

## 2023-09-11 LAB — PROTEIN / CREATININE RATIO, URINE: Creatinine, Urine: 78

## 2023-09-12 ENCOUNTER — Other Ambulatory Visit: Payer: Self-pay | Admitting: Student

## 2023-09-12 DIAGNOSIS — Z1231 Encounter for screening mammogram for malignant neoplasm of breast: Secondary | ICD-10-CM

## 2023-09-12 LAB — COMPLETE METABOLIC PANEL WITH GFR
AG Ratio: 1.5 (calc) (ref 1.0–2.5)
ALT: 18 U/L (ref 6–29)
AST: 20 U/L (ref 10–35)
Albumin: 4.1 g/dL (ref 3.6–5.1)
Alkaline phosphatase (APISO): 69 U/L (ref 37–153)
BUN/Creatinine Ratio: 21 (calc) (ref 6–22)
BUN: 22 mg/dL (ref 7–25)
CO2: 30 mmol/L (ref 20–32)
Calcium: 8.8 mg/dL (ref 8.6–10.4)
Chloride: 102 mmol/L (ref 98–110)
Creat: 1.07 mg/dL — ABNORMAL HIGH (ref 0.60–1.00)
Globulin: 2.7 g/dL (ref 1.9–3.7)
Glucose, Bld: 100 mg/dL — ABNORMAL HIGH (ref 65–99)
Potassium: 4.3 mmol/L (ref 3.5–5.3)
Sodium: 138 mmol/L (ref 135–146)
Total Bilirubin: 0.8 mg/dL (ref 0.2–1.2)
Total Protein: 6.8 g/dL (ref 6.1–8.1)
eGFR: 53 mL/min/{1.73_m2} — ABNORMAL LOW (ref >=60)

## 2023-09-12 LAB — HEMOGLOBIN A1C
Hgb A1c MFr Bld: 6.7 %{Hb} — ABNORMAL HIGH (ref <5.7)
Mean Plasma Glucose: 146 mg/dL
eAG (mmol/L): 8.1 mmol/L

## 2023-09-12 LAB — VITAMIN B12: Vitamin B-12: 307 pg/mL (ref 200–1100)

## 2023-09-12 LAB — VITAMIN D 25 HYDROXY (VIT D DEFICIENCY, FRACTURES): Vit D, 25-Hydroxy: 61 ng/mL (ref 30–100)

## 2023-09-12 LAB — CBC WITH DIFFERENTIAL/PLATELET
Absolute Lymphocytes: 1769 {cells}/uL (ref 850–3900)
Absolute Monocytes: 528 {cells}/uL (ref 200–950)
Basophils Absolute: 70 {cells}/uL (ref 0–200)
Basophils Relative: 1.2 %
Eosinophils Absolute: 157 {cells}/uL (ref 15–500)
Eosinophils Relative: 2.7 %
HCT: 41.9 % (ref 35.0–45.0)
Hemoglobin: 13 g/dL (ref 11.7–15.5)
MCH: 28.6 pg (ref 27.0–33.0)
MCHC: 31 g/dL — ABNORMAL LOW (ref 32.0–36.0)
MCV: 92.1 fL (ref 80.0–100.0)
MPV: 9.5 fL (ref 7.5–12.5)
Monocytes Relative: 9.1 %
Neutro Abs: 3277 {cells}/uL (ref 1500–7800)
Neutrophils Relative %: 56.5 %
Platelets: 299 10*3/uL (ref 140–400)
RBC: 4.55 10*6/uL (ref 3.80–5.10)
RDW: 12.6 % (ref 11.0–15.0)
Total Lymphocyte: 30.5 %
WBC: 5.8 10*3/uL (ref 3.8–10.8)

## 2023-09-12 LAB — PTH, INTACT AND CALCIUM
Calcium: 8.8 mg/dL (ref 8.6–10.4)
PTH: 111 pg/mL — ABNORMAL HIGH (ref 16–77)

## 2023-09-12 LAB — TSH: TSH: 1.83 m[IU]/L (ref 0.40–4.50)

## 2023-09-12 LAB — HEPATITIS C ANTIBODY: Hepatitis C Ab: NONREACTIVE

## 2023-09-12 LAB — T4: T4, Total: 6.5 ug/dL (ref 5.1–11.9)

## 2023-09-15 LAB — UNLABELED

## 2023-09-15 LAB — HOUSE ACCOUNT TRACKING

## 2023-09-15 LAB — TIQ-NTR

## 2023-09-16 LAB — PAT ID TIQ DOC: Test Affected: 6517

## 2023-09-16 LAB — MICROALBUMIN / CREATININE URINE RATIO
Creatinine, Urine: 78 mg/dL (ref 20–275)
Microalb, Ur: <0.2 mg/dL

## 2023-09-16 LAB — HOUSE ACCOUNT TRACKING

## 2023-09-26 ENCOUNTER — Ambulatory Visit: Payer: Medicare PPO | Admitting: Student

## 2023-10-06 DIAGNOSIS — M4712 Other spondylosis with myelopathy, cervical region: Secondary | ICD-10-CM | POA: Diagnosis not present

## 2023-10-07 ENCOUNTER — Ambulatory Visit
Admission: RE | Admit: 2023-10-07 | Discharge: 2023-10-07 | Disposition: A | Discharge status: Home / Self Care | Payer: Medicare PPO | Source: Ambulatory Visit | Attending: Student | Admitting: Student

## 2023-10-07 DIAGNOSIS — Z1231 Encounter for screening mammogram for malignant neoplasm of breast: Secondary | ICD-10-CM

## 2023-10-08 ENCOUNTER — Ambulatory Visit: Payer: Medicare PPO | Admitting: Student

## 2023-10-08 ENCOUNTER — Encounter: Payer: Self-pay | Admitting: Student

## 2023-10-08 VITALS — BP 136/78 | HR 67 | Temp 97.8°F | Resp 20 | Ht 60.0 in | Wt 196.0 lb

## 2023-10-08 DIAGNOSIS — I10 Essential (primary) hypertension: Secondary | ICD-10-CM | POA: Diagnosis not present

## 2023-10-08 DIAGNOSIS — Z794 Long term (current) use of insulin: Secondary | ICD-10-CM | POA: Diagnosis not present

## 2023-10-08 DIAGNOSIS — Z66 Do not resuscitate: Secondary | ICD-10-CM | POA: Diagnosis not present

## 2023-10-08 DIAGNOSIS — M1710 Unilateral primary osteoarthritis, unspecified knee: Secondary | ICD-10-CM | POA: Diagnosis not present

## 2023-10-08 MED ORDER — CARVEDILOL 12.5 MG PO TABS: 25.0000 mg | ORAL_TABLET | Freq: Two times a day (BID) | ORAL | Status: DC

## 2023-10-08 MED ORDER — ACCU-CHEK GUIDE TEST VI STRP: 1.0000 | ORAL_STRIP | Freq: Every day | 3 refills | Status: DC

## 2023-10-08 MED ORDER — ETODOLAC 400 MG PO TABS: 400.0000 mg | ORAL_TABLET | Freq: Every day | ORAL | 2 refills | Status: DC

## 2023-10-08 NOTE — Progress Notes (Unsigned)
Location:   [facility]   Place of Service:   [facility]  Provider: Sydnee Cabal  Code Status: DNR Goals of Care:     10/08/2023    3:41 PM  Advanced Directives  Does Patient Have a Medical Advance Directive? Yes  Type of Estate agent of [place];Out of facility DNR (pink MOST or yellow form);Living will  Does patient want to make changes to medical advance directive? No - Patient declined  Copy of Healthcare Power of Attorney in Chart? Yes - validated most recent copy scanned in chart (See row information)     Chief Complaint  Patient presents with   Follow-up    Blood pressure check    HPI: Patient is a 77 y.o. female seen today for medical management of chronic diseases.    She has been taking new medication. No side effects. Average   ACDF surgery 3 years ago - her hands are starting to go numb again. He suggested she take lyrica. Tried gabapentin and tramadol and didn't tolerate. Doesn't want to take lyrica because the pain is intermittent.   Diabetes - she has made the adjustment for her meal time medications. The phamacy never sent the she is interested in CGM. Can discuss in the new year.    Past Medical History:  Diagnosis Date   Cataract    Colon polyps    Diabetes mellitus    Diverticulosis    GERD (gastroesophageal reflux disease)    occ-no meds   Hyperlipidemia    Hypertension    Neuropathy    Nonproliferative diabetic retinopathy of both eyes (HCC) 10/31/2020   Osteoarthritis    Tingling    neck   Wears glasses     Past Surgical History:  Procedure Laterality Date   ABDOMINAL HYSTERECTOMY  11/11/1985   ACDF     2021   COLONOSCOPY     GANGLION CYST EXCISION Right 03/17/2013   Procedure: EXCISION MASS RIGHT WRIST;  Surgeon: Nicki Reaper, MD;  Location: [facility];  Service: Orthopedics;  Laterality: Right;   HAND SURGERY Right    right-as child    Allergies  Allergen Reactions   Codeine Nausea And  Vomiting   Hydrocodone Other (See Comments)    Dizzy   Hydrocodone-Acetaminophen Itching   Oxycodone Other (See Comments)    Dizzy   Penicillin G     Other Reaction(s): Unknown   Penicillins    Tape Other (See Comments)    Burns skin   Zoster Vaccine Live Other (See Comments)   Amoxicillin Rash    Outpatient Encounter Medications as of 10/08/2023  Medication Sig   amLODipine (NORVASC) 10 MG tablet Take 1 tablet (10 mg total) by mouth daily.   atorvastatin (LIPITOR) 40 MG tablet Take 1 tablet (40 mg total) by mouth daily.   B Complex Vitamins (B COMPLEX 50 PO) Take 1 tablet by mouth daily.   Blood Glucose Calibration (ACCU-CHEK AVIVA VI) Use as Directed. Dx: E11.9   canagliflozin (INVOKANA) 100 MG TABS tablet Take 1 tablet (100 mg total) by mouth daily before breakfast.   carvedilol (COREG) 12.5 MG tablet Take 1 tablet (12.5 mg total) by mouth 2 (two) times daily with a meal.   Cholecalciferol (VITAMIN D3 PO) Take 2 capsules by mouth daily.   cholestyramine (QUESTRAN) 4 g packet Take 1 packet (4 g total) by mouth daily.   CINNAMON PO Take by mouth daily.   co-enzyme Q-10 50 MG capsule  Take 100 mg by mouth daily.   etodolac (LODINE) 400 MG tablet Take 1 tablet (400 mg total) by mouth daily.   fish oil-omega-3 fatty acids 1000 MG capsule Take 2 g by mouth daily.   furosemide (LASIX) 40 MG tablet Take 1 tablet (40 mg total) by mouth daily.   glucose blood (ACCU-CHEK GUIDE TEST) test strip 1 each by Other route daily. Use as instructed   insulin lispro (HUMALOG) 100 UNIT/ML injection Inject 0.4 mLs (40 Units total) into the skin daily.   MAGNESIUM-ZINC PO Take by mouth daily.   Multiple Vitamin (MULTIVITAMIN) capsule Take 1 capsule by mouth daily.     valsartan (DIOVAN) 320 MG tablet Take 1 tablet (320 mg total) by mouth daily.   vitamin B-12 (CYANOCOBALAMIN) 1000 MCG tablet Take 1,000 mcg by mouth daily.   glucose blood (ACCU-CHEK AVIVA PLUS) test strip  (Patient not taking: Reported  on 10/08/2023)   No facility-administered encounter medications on file as of 10/08/2023.    Review of Systems:  Review of Systems  Health Maintenance  Topic Date Due   Medicare Annual Wellness (AWV)  Never done   Zoster Vaccines- Shingrix (1 of 2) 01/01/1965   DEXA SCAN  Never done   OPHTHALMOLOGY EXAM  11/13/2022   COVID-19 Vaccine (9 - 2023-24 season) 09/15/2023   HEMOGLOBIN A1C  03/10/2024   FOOT EXAM  09/09/2024   Diabetic kidney evaluation - eGFR measurement  09/10/2024   Diabetic kidney evaluation - Urine ACR  09/10/2024   DTaP/Tdap/Td (2 - Td or Tdap) 08/30/2030   Pneumonia Vaccine 53+ Years old  Completed   INFLUENZA VACCINE  Completed   Hepatitis C Screening  Completed   HPV VACCINES  Aged Out   Colonoscopy  Discontinued    Physical Exam: Vitals:   10/08/23 1545  BP: 136/78  Pulse: 67  Resp: 20  Temp: 97.8 F (36.6 C)  SpO2: 98%  Weight: 196 lb (88.9 kg)  Height: 5' (1.524 m)   Body mass index is 38.28 kg/m. Physical Exam Constitutional:      Appearance: Normal appearance.  Cardiovascular:     Rate and Rhythm: Normal rate and regular rhythm.  Pulmonary:     Effort: Pulmonary effort is normal.     Breath sounds: Normal breath sounds.  Neurological:     Mental Status: She is alert and oriented to person, place, and time.    Labs reviewed: Basic Metabolic Panel: Recent Labs    09/11/23 0740  NA 138  K 4.3  CL 102  CO2 30  GLUCOSE 100*  BUN 22  CREATININE 1.07*  CALCIUM 8.8  8.8  TSH 1.83   Liver Function Tests: Recent Labs    09/11/23 0740  AST 20  ALT 18  BILITOT 0.8  PROT 6.8   No results for input(s): "LIPASE", "AMYLASE" in the last 8760 hours. No results for input(s): "AMMONIA" in the last 8760 hours. CBC: Recent Labs    09/11/23 0740  WBC 5.8  NEUTROABS 3,277  HGB 13.0  HCT 41.9  MCV 92.1  PLT 299   Lipid Panel: No results for input(s): "CHOL", "HDL", "LDLCALC", "TRIG", "CHOLHDL", "LDLDIRECT" in the last 8760  hours. Lab Results  Component Value Date   HGBA1C 6.7 (H) 09/11/2023    Procedures since last visit: No results found.  Assessment/Plan Essential hypertension - Plan: carvedilol (COREG) 12.5 MG tablet  DNR (do not resuscitate)  Long term (current) use of insulin (HCC) - Plan: glucose blood (ACCU-CHEK GUIDE  TEST) test strip  Primary osteoarthritis of knee, unspecified laterality - Plan: etodolac (LODINE) 400 MG tablet BP mildly improved with addition of carvedilol. Increase dose to 25 mg BID. Discussed possibly addition of additional medications such as isosorbide dinitrate, clonidine, hydralazine. Patient is interested in medications that can be taken 1-2x per day. Continue valsartan 320 mg daily, amlodipine 10, lasix 40 mg daily. Plan for patient to send BP log in 2 weeks and determine need for additional medications. Fu 1 mo.    Labs/tests ordered:  * No order type specified * Next appt:  Visit date not found

## 2023-10-08 NOTE — Patient Instructions (Signed)
Please take 25 mg carvidolol daily (2 tablets in the morning and evening).   Please keep checking your blood pressure and send me your morning values in 2 weeks and we can determine need for additional medication. If so, we can follow up in person in about 1 month.

## 2023-10-09 ENCOUNTER — Encounter: Payer: Self-pay | Admitting: Student

## 2023-10-10 DIAGNOSIS — E1129 Type 2 diabetes mellitus with other diabetic kidney complication: Secondary | ICD-10-CM | POA: Diagnosis not present

## 2023-10-17 ENCOUNTER — Ambulatory Visit
Admission: RE | Admit: 2023-10-17 | Discharge: 2023-10-17 | Disposition: A | Discharge status: Home / Self Care | Payer: Medicare PPO | Source: Ambulatory Visit | Attending: Student | Admitting: Student

## 2023-10-17 DIAGNOSIS — E2839 Other primary ovarian failure: Secondary | ICD-10-CM | POA: Diagnosis not present

## 2023-10-17 DIAGNOSIS — M85852 Other specified disorders of bone density and structure, left thigh: Secondary | ICD-10-CM | POA: Diagnosis not present

## 2023-10-22 DIAGNOSIS — M4712 Other spondylosis with myelopathy, cervical region: Secondary | ICD-10-CM | POA: Diagnosis not present

## 2023-10-29 ENCOUNTER — Encounter: Payer: Self-pay | Admitting: Student

## 2023-10-29 DIAGNOSIS — Z794 Long term (current) use of insulin: Secondary | ICD-10-CM

## 2023-10-29 DIAGNOSIS — I10 Essential (primary) hypertension: Secondary | ICD-10-CM

## 2023-10-30 DIAGNOSIS — R200 Anesthesia of skin: Secondary | ICD-10-CM | POA: Diagnosis not present

## 2023-10-30 DIAGNOSIS — M5023 Other cervical disc displacement, cervicothoracic region: Secondary | ICD-10-CM | POA: Diagnosis not present

## 2023-10-30 DIAGNOSIS — M4712 Other spondylosis with myelopathy, cervical region: Secondary | ICD-10-CM | POA: Diagnosis not present

## 2023-10-30 DIAGNOSIS — G8929 Other chronic pain: Secondary | ICD-10-CM | POA: Diagnosis not present

## 2023-10-30 DIAGNOSIS — M4802 Spinal stenosis, cervical region: Secondary | ICD-10-CM | POA: Diagnosis not present

## 2023-10-31 ENCOUNTER — Ambulatory Visit: Payer: Medicare PPO | Admitting: Student

## 2023-11-04 ENCOUNTER — Encounter: Payer: Self-pay | Admitting: Student

## 2023-11-04 DIAGNOSIS — Z794 Long term (current) use of insulin: Secondary | ICD-10-CM

## 2023-11-06 DIAGNOSIS — M4712 Other spondylosis with myelopathy, cervical region: Secondary | ICD-10-CM | POA: Diagnosis not present

## 2023-11-06 MED ORDER — ACCU-CHEK GUIDE TEST VI STRP: 1.0000 | ORAL_STRIP | Freq: Three times a day (TID) | 4 refills | Status: DC

## 2023-11-06 NOTE — Telephone Encounter (Signed)
Message forwarded to Earnestine Mealing, MD to review and confirm testing frequency. Medication list will need to be updated to reflect how many times the provider would like for patient to test blood sugar

## 2023-11-20 NOTE — Progress Notes (Signed)
 Location:   [facility] Parts of Service:   Clinic  Provider: Dr. Richerd Brigham  Code Status: DNR Goals of Care:     11/21/2023   12:11 PM  Advanced Directives  Does Patient Have a Medical Advance Directive? Yes  Type of Estate Agent of [place];Out of facility DNR (pink MOST or yellow form);Living will  Does patient want to make changes to medical advance directive? No - Patient declined  Copy of Healthcare Power of Attorney in Chart? Yes - validated most recent copy scanned in chart (See row information)     Chief Complaint  Patient presents with   Medical Management of Chronic Issues    Medical Management of Chronic Issues. 1 Month Follow up   Quality Metric Gaps    To discuss need for Zoster, Covid, Eye, and AWV. Has eye appointment scheduled for 11/24/2023.    HPI: Patient is a 78 y.o. female seen today for medical management of chronic diseases.   Discussed the use of AI scribe software for clinical note transcription with the patient, who gave verbal consent to proceed.  History of Present Illness   The patient, with a history of hypertension and potential need for ACDF surgery, reports having recently undergone an MRI. They have been attending physical therapy sessions weekly in anticipation of potential surgery. They note that during a recent vacation, they experienced minimal issues with their arms and hands falling asleep, which they speculate may be due to a change in pillow use.  They have been monitoring their blood sugar levels with test strips, which were recently replenished after a brief shortage. They report no new symptoms or issues related to their blood sugar levels.  The patient's primary concern is their persistently high blood pressure. They have been taking metoprolol  and amlodipine  at night, and have recently stopped taking carvedilol  as per previous medical advice. They have also been prescribed hydrochlorothiazide  (HCTZ), a  diuretic, which they plan to start taking in the morning to avoid nocturia. They express some concern about potential dizziness as a side effect of their medication regimen.       Past Medical History:  Diagnosis Date   Cataract    Colon polyps    Diabetes mellitus    Diverticulosis    GERD (gastroesophageal reflux disease)    occ-no meds   Hyperlipidemia    Hypertension    Neuropathy    Nonproliferative diabetic retinopathy of both eyes (HCC) 10/31/2020   Osteoarthritis    Tingling    neck   Wears glasses     Past Surgical History:  Procedure Laterality Date   ABDOMINAL HYSTERECTOMY  11/11/1985   ACDF     2021   COLONOSCOPY     GANGLION CYST EXCISION Right 03/17/2013   Procedure: EXCISION MASS RIGHT WRIST;  Surgeon: Arley JONELLE Curia, MD;  Location: [facility];  Service: Orthopedics;  Laterality: Right;   HAND SURGERY Right    right-as child    Allergies  Allergen Reactions   Codeine Nausea And Vomiting   Hydrocodone  Other (See Comments)    Dizzy   Hydrocodone -Acetaminophen  Itching   Oxycodone  Other (See Comments)    Dizzy   Penicillin G     Other Reaction(s): Unknown   Penicillins    Tape Other (See Comments)    Burns skin   Zoster Vaccine Live Other (See Comments)   Amoxicillin Rash    Outpatient Encounter Medications as of 11/21/2023  Medication Sig  amLODipine  (NORVASC ) 10 MG tablet Take 1 tablet (10 mg total) by mouth daily.   atorvastatin  (LIPITOR) 40 MG tablet Take 1 tablet (40 mg total) by mouth daily.   B Complex Vitamins (B COMPLEX 50 PO) Take 1 tablet by mouth daily.   Blood Glucose Calibration (ACCU-CHEK AVIVA VI) Use as Directed. Dx: E11.9   canagliflozin  (INVOKANA ) 100 MG TABS tablet Take 1 tablet (100 mg total) by mouth daily before breakfast.   Cholecalciferol (VITAMIN D3 PO) Take 2 capsules by mouth daily.   cholestyramine  (QUESTRAN ) 4 g packet Take 1 packet (4 g total) by mouth daily.   CINNAMON PO Take by mouth daily.    co-enzyme Q-10 50 MG capsule Take 100 mg by mouth daily.   etodolac  (LODINE ) 400 MG tablet Take 1 tablet (400 mg total) by mouth daily.   fish oil-omega-3 fatty acids 1000 MG capsule Take 2 g by mouth daily.   furosemide  (LASIX ) 40 MG tablet Take 1 tablet (40 mg total) by mouth daily.   glucose blood (ACCU-CHEK GUIDE TEST) test strip 1 each by Other route 4 (four) times daily -  before meals and at bedtime. Use as instructed   hydrochlorothiazide  (HYDRODIURIL ) 25 MG tablet Take 1 tablet (25 mg total) by mouth daily.   insulin  lispro (HUMALOG ) 100 UNIT/ML injection Inject 0.4 mLs (40 Units total) into the skin daily.   MAGNESIUM-ZINC PO Take by mouth daily.   Multiple Vitamin (MULTIVITAMIN) capsule Take 1 capsule by mouth daily.     valsartan  (DIOVAN ) 320 MG tablet Take 1 tablet (320 mg total) by mouth daily.   vitamin B-12 (CYANOCOBALAMIN ) 1000 MCG tablet Take 1,000 mcg by mouth daily.   [DISCONTINUED] metoprolol  succinate (TOPROL -XL) 50 MG 24 hr tablet Take 50 mg by mouth daily. Take with or immediately following a meal.   metoprolol  succinate (TOPROL -XL) 50 MG 24 hr tablet Take 1 tablet (50 mg total) by mouth daily. Take with or immediately following a meal.   [DISCONTINUED] carvedilol  (COREG ) 12.5 MG tablet Take 2 tablets (25 mg total) by mouth 2 (two) times daily with a meal. (Patient not taking: Reported on 11/21/2023)   No facility-administered encounter medications on file as of 11/21/2023.    Review of Systems:  Review of Systems  Health Maintenance  Topic Date Due   Zoster Vaccines- Shingrix (1 of 2) 01/01/1965   OPHTHALMOLOGY EXAM  11/13/2022   COVID-19 Vaccine (9 - 2024-25 season) 09/15/2023   Medicare Annual Wellness (AWV)  10/03/2023   HEMOGLOBIN A1C  03/10/2024   FOOT EXAM  09/09/2024   Diabetic kidney evaluation - eGFR measurement  09/10/2024   Diabetic kidney evaluation - Urine ACR  09/10/2024   DTaP/Tdap/Td (2 - Td or Tdap) 08/30/2030   Pneumonia Vaccine 29+ Years old   Completed   INFLUENZA VACCINE  Completed   DEXA SCAN  Completed   Hepatitis C Screening  Completed   HPV VACCINES  Aged Out   Colonoscopy  Discontinued    Physical Exam: Vitals:   11/21/23 1205 11/21/23 1213  BP: (!) 164/78 (!) 164/78  Pulse: 70   Temp: (!) 97.2 F (36.2 C)   SpO2: 97%   Weight: 195 lb (88.5 kg)   Height: 5' (1.524 m)    Body mass index is 38.08 kg/m. Physical Exam Physical Exam          Labs reviewed: Basic Metabolic Panel: Recent Labs    09/11/23 0740  NA 138  K 4.3  CL 102  CO2  30  GLUCOSE 100*  BUN 22  CREATININE 1.07*  CALCIUM  8.8  8.8  TSH 1.83   Liver Function Tests: Recent Labs    09/11/23 0740  AST 20  ALT 18  BILITOT 0.8  PROT 6.8   No results for input(s): LIPASE, AMYLASE in the last 8760 hours. No results for input(s): AMMONIA in the last 8760 hours. CBC: Recent Labs    09/11/23 0740  WBC 5.8  NEUTROABS 3,277  HGB 13.0  HCT 41.9  MCV 92.1  PLT 299   Lipid Panel: No results for input(s): CHOL, HDL, LDLCALC, TRIG, CHOLHDL, LDLDIRECT in the last 8760 hours. Lab Results  Component Value Date   HGBA1C 6.7 (H) 09/11/2023    Procedures since last visit: No results found.  Assessment/Plan Assessment and Plan    Hypertension Persistent hypertension despite current regimen of metoprolol , amlodipine , and valsartan . Carvedilol  was discontinued. HCTZ recommended but not yet started. Discussed alternative medications such as hydralazine and clonidine, including mechanisms of action and potential side effects like rebound hypertension and dizziness. Hydralazine works through vasodilation; clonidine is an alpha blocker. Discussed multiple daily doses or clonidine patch to avoid rebound hypertension. - Send prescription for HCTZ via mail order - Instruct to take HCTZ in the morning - Check labs in 1-2 weeks after starting new medication - Schedule follow-up appointment in early February - Monitor blood  pressure twice daily once HCTZ is started  Cervical Radiculopathy Intermittent symptoms of arms and hands going to sleep, potentially related to cervical spine issues. MRI completed, awaiting neurosurgeon consultation on January 20th to determine need for ACDF surgery. Currently attending physical therapy once a week as a prerequisite for potential surgery. Noted improvement in symptoms while on vacation, possibly related to different pillow use. - Continue physical therapy once a week - Follow up with neurosurgeon on January 20th  Diabetes Mellitus Received updated prescription for test strips to monitor blood glucose levels. No issues reported with current management. - Continue current diabetes management and monitoring  Follow-up - Schedule follow-up appointment in early February - Ensure labs are done before the follow-up appointment.

## 2023-11-21 ENCOUNTER — Ambulatory Visit: Payer: Medicare PPO | Admitting: Student

## 2023-11-21 ENCOUNTER — Encounter: Payer: Self-pay | Admitting: Student

## 2023-11-21 VITALS — BP 164/78 | HR 70 | Temp 97.2°F | Ht 60.0 in | Wt 195.0 lb

## 2023-11-21 DIAGNOSIS — E66812 Obesity, class 2: Secondary | ICD-10-CM

## 2023-11-21 DIAGNOSIS — M4712 Other spondylosis with myelopathy, cervical region: Secondary | ICD-10-CM | POA: Diagnosis not present

## 2023-11-21 DIAGNOSIS — Z6838 Body mass index (BMI) 38.0-38.9, adult: Secondary | ICD-10-CM | POA: Diagnosis not present

## 2023-11-21 DIAGNOSIS — M5412 Radiculopathy, cervical region: Secondary | ICD-10-CM | POA: Diagnosis not present

## 2023-11-21 DIAGNOSIS — I10 Essential (primary) hypertension: Secondary | ICD-10-CM

## 2023-11-21 DIAGNOSIS — E113393 Type 2 diabetes mellitus with moderate nonproliferative diabetic retinopathy without macular edema, bilateral: Secondary | ICD-10-CM | POA: Diagnosis not present

## 2023-11-21 MED ORDER — HYDROCHLOROTHIAZIDE 25 MG PO TABS: 25.0000 mg | ORAL_TABLET | Freq: Every day | ORAL | 3 refills | Status: DC

## 2023-11-21 MED ORDER — METOPROLOL SUCCINATE ER 50 MG PO TB24: 50.0000 mg | ORAL_TABLET | Freq: Every day | ORAL | 3 refills | Status: DC

## 2023-11-21 NOTE — Patient Instructions (Addendum)
 Please come to the lab Monday or Thursday before your appointment  at 7:30AM to have your labs collected.   VISIT SUMMARY:  During today's visit, we discussed your ongoing health concerns, including your high blood pressure, cervical radiculopathy, and diabetes management. We reviewed your current medications and made some adjustments to better manage your conditions. We also discussed your recent MRI and upcoming neurosurgeon consultation.  YOUR PLAN:  -HYPERTENSION: Hypertension means high blood pressure. Despite your current medications, your blood pressure remains high. We have recommended starting hydrochlorothiazide  (HCTZ) in the morning to help manage this. We also discussed other medication options like hydralazine and clonidine, their mechanisms, and potential side effects. Please monitor your blood pressure twice daily once you start HCTZ, and we will check your labs in 3 weeks.  -CERVICAL RADICULOPATHY: Cervical radiculopathy is a condition where nerve roots in the cervical spine are compressed, causing symptoms like your arms and hands falling asleep. You have completed an MRI and will consult with a neurosurgeon on January 20th to determine if surgery is needed. Continue with your weekly physical therapy sessions.  -DIABETES MELLITUS: Diabetes mellitus is a condition that affects your blood sugar levels. You have received an updated prescription for test strips and reported no issues with your current management. Continue monitoring your blood glucose levels as advised.  INSTRUCTIONS:  Please schedule a follow-up appointment in early February and ensure you complete your lab tests before this appointment. Continue monitoring your blood pressure twice daily after starting HCTZ and follow up with the neurosurgeon on January 20th.

## 2023-11-27 DIAGNOSIS — M4712 Other spondylosis with myelopathy, cervical region: Secondary | ICD-10-CM | POA: Diagnosis not present

## 2023-12-01 DIAGNOSIS — M4712 Other spondylosis with myelopathy, cervical region: Secondary | ICD-10-CM | POA: Diagnosis not present

## 2023-12-01 DIAGNOSIS — Z6838 Body mass index (BMI) 38.0-38.9, adult: Secondary | ICD-10-CM | POA: Diagnosis not present

## 2023-12-02 DIAGNOSIS — H43813 Vitreous degeneration, bilateral: Secondary | ICD-10-CM | POA: Diagnosis not present

## 2023-12-02 DIAGNOSIS — H35043 Retinal micro-aneurysms, unspecified, bilateral: Secondary | ICD-10-CM | POA: Diagnosis not present

## 2023-12-02 DIAGNOSIS — E113393 Type 2 diabetes mellitus with moderate nonproliferative diabetic retinopathy without macular edema, bilateral: Secondary | ICD-10-CM | POA: Diagnosis not present

## 2023-12-16 ENCOUNTER — Encounter: Payer: Self-pay | Admitting: Student

## 2023-12-16 NOTE — Telephone Encounter (Signed)
Message routed to PCP Beamer, Victoria, MD  

## 2023-12-18 ENCOUNTER — Encounter: Payer: Self-pay | Admitting: Student

## 2023-12-18 DIAGNOSIS — Z794 Long term (current) use of insulin: Secondary | ICD-10-CM | POA: Diagnosis not present

## 2023-12-18 DIAGNOSIS — I10 Essential (primary) hypertension: Secondary | ICD-10-CM | POA: Diagnosis not present

## 2023-12-18 LAB — BASIC METABOLIC PANEL WITH GFR
BUN/Creatinine Ratio: 25 (calc) — ABNORMAL HIGH (ref 6–22)
BUN: 29 mg/dL — ABNORMAL HIGH (ref 7–25)
CO2: 27 mmol/L (ref 20–32)
Calcium: 8.9 mg/dL (ref 8.6–10.4)
Chloride: 103 mmol/L (ref 98–110)
Creat: 1.16 mg/dL — ABNORMAL HIGH (ref 0.60–1.00)
Glucose, Bld: 94 mg/dL (ref 65–99)
Potassium: 4.4 mmol/L (ref 3.5–5.3)
Sodium: 139 mmol/L (ref 135–146)
eGFR: 49 mL/min/{1.73_m2} — ABNORMAL LOW (ref >=60)

## 2023-12-18 LAB — MAGNESIUM: Magnesium: 2.3 mg/dL (ref 1.5–2.5)

## 2023-12-18 LAB — MICROALBUMIN / CREATININE URINE RATIO
Creatinine, Urine: 109 mg/dL (ref 20–275)
Microalb Creat Ratio: 5 mg/g{creat} (ref <30)
Microalb, Ur: 0.5 mg/dL

## 2023-12-19 ENCOUNTER — Telehealth: Payer: Self-pay

## 2023-12-19 ENCOUNTER — Telehealth: Payer: Self-pay | Admitting: Student

## 2023-12-19 DIAGNOSIS — E113393 Type 2 diabetes mellitus with moderate nonproliferative diabetic retinopathy without macular edema, bilateral: Secondary | ICD-10-CM

## 2023-12-19 DIAGNOSIS — I10 Essential (primary) hypertension: Secondary | ICD-10-CM

## 2023-12-19 DIAGNOSIS — I129 Hypertensive chronic kidney disease with stage 1 through stage 4 chronic kidney disease, or unspecified chronic kidney disease: Secondary | ICD-10-CM

## 2023-12-19 NOTE — Telephone Encounter (Signed)
 Contacted patient to discuss patient's blood pressure range. Majority less than 135/80. Discussed plan to add patient's A1c to previously collected lab testing. If unable to, patient will return to get A1c collected. Plan to follow up with patient in person in 3 months. She will need labs beforehand. Messaged MA to have orders printed for this visit. She will need A1c, bmp, cbc, tsh at that time.

## 2023-12-19 NOTE — Telephone Encounter (Signed)
 Patient stopped by the office stating she had an appointment today and it was determined that she did not. Patient states Dr.Beamer told her to follow-up the first week of Feb. Patient states she did not understand that she needed to call and schedule that appointment herself.  I offered to schedule patient an appointment and she politely declined and expressed her frustration with this process. Patient left her B/P home reading for Dr.Beamer to review, asked why an A1C was not checked at her last lab draw.  Please advise

## 2023-12-22 ENCOUNTER — Encounter: Payer: Self-pay | Admitting: Student

## 2023-12-22 NOTE — Telephone Encounter (Signed)
Awaiting reply from provider.

## 2023-12-22 NOTE — Telephone Encounter (Signed)
 Cindy Gehrig, MD  Juanna Norman minutes ago (7:42 AM)   VB Called patient on 2/7

## 2023-12-23 NOTE — Telephone Encounter (Signed)
Patient will wait and get all labs drawn on 03/22/2024

## 2023-12-23 NOTE — Telephone Encounter (Signed)
Cindy Mealing, MD  You4 days ago   VB Please contact patient for a 3 month follow up appointment. She will need labs before hand. I will order them now. Please print next time you are in clinic      Called and spoke with patient.  Appointment scheduled 03/24/24 Lab appointment scheduled 03/22/24  Orders printed and placed in tray at [facility].

## 2023-12-29 ENCOUNTER — Other Ambulatory Visit: Payer: Self-pay

## 2023-12-29 DIAGNOSIS — Z794 Long term (current) use of insulin: Secondary | ICD-10-CM

## 2023-12-29 MED ORDER — ACCU-CHEK AVIVA VI SOLN: 1.0000 | Freq: Once | 3 refills | Status: AC

## 2023-12-29 MED ORDER — ACCU-CHEK GUIDE TEST VI STRP: 1.0000 | ORAL_STRIP | Freq: Three times a day (TID) | 4 refills | Status: AC

## 2023-12-29 NOTE — Telephone Encounter (Signed)
 Patient has request refill on supplies. Please provider directions. Medication pend and sent to PCP Earnestine Mealing, MD for approval.

## 2023-12-30 ENCOUNTER — Encounter: Payer: Self-pay | Admitting: Student

## 2023-12-30 DIAGNOSIS — E113393 Type 2 diabetes mellitus with moderate nonproliferative diabetic retinopathy without macular edema, bilateral: Secondary | ICD-10-CM

## 2023-12-30 MED ORDER — INSULIN LISPRO 100 UNIT/ML IJ SOLN: 40.0000 [IU] | Freq: Every day | INTRAMUSCULAR | 1 refills | Status: DC

## 2023-12-30 NOTE — Telephone Encounter (Signed)
 High risk or very high risk warning populated when attempting to refill medication. RX request sent to PCP for review and approval if warranted.

## 2024-03-05 DIAGNOSIS — E1129 Type 2 diabetes mellitus with other diabetic kidney complication: Secondary | ICD-10-CM | POA: Diagnosis not present

## 2024-03-22 DIAGNOSIS — I10 Essential (primary) hypertension: Secondary | ICD-10-CM | POA: Diagnosis not present

## 2024-03-22 DIAGNOSIS — E113393 Type 2 diabetes mellitus with moderate nonproliferative diabetic retinopathy without macular edema, bilateral: Secondary | ICD-10-CM | POA: Diagnosis not present

## 2024-03-22 DIAGNOSIS — I129 Hypertensive chronic kidney disease with stage 1 through stage 4 chronic kidney disease, or unspecified chronic kidney disease: Secondary | ICD-10-CM | POA: Diagnosis not present

## 2024-03-23 LAB — CBC WITH DIFFERENTIAL/PLATELET
Absolute Lymphocytes: 2070 {cells}/uL (ref 850–3900)
Absolute Monocytes: 640 {cells}/uL (ref 200–950)
Basophils Absolute: 63 {cells}/uL (ref 0–200)
Basophils Relative: 0.8 %
Eosinophils Absolute: 229 {cells}/uL (ref 15–500)
Eosinophils Relative: 2.9 %
HCT: 37.7 % (ref 35.0–45.0)
Hemoglobin: 12.2 g/dL (ref 11.7–15.5)
MCH: 28.9 pg (ref 27.0–33.0)
MCHC: 32.4 g/dL (ref 32.0–36.0)
MCV: 89.3 fL (ref 80.0–100.0)
MPV: 9.5 fL (ref 7.5–12.5)
Monocytes Relative: 8.1 %
Neutro Abs: 4898 {cells}/uL (ref 1500–7800)
Neutrophils Relative %: 62 %
Platelets: 312 10*3/uL (ref 140–400)
RBC: 4.22 10*6/uL (ref 3.80–5.10)
RDW: 13.1 % (ref 11.0–15.0)
Total Lymphocyte: 26.2 %
WBC: 7.9 10*3/uL (ref 3.8–10.8)

## 2024-03-23 LAB — COMPREHENSIVE METABOLIC PANEL WITH GFR
AG Ratio: 1.4 (calc) (ref 1.0–2.5)
ALT: 14 U/L (ref 6–29)
AST: 18 U/L (ref 10–35)
Albumin: 4 g/dL (ref 3.6–5.1)
Alkaline phosphatase (APISO): 62 U/L (ref 37–153)
BUN/Creatinine Ratio: 24 (calc) — ABNORMAL HIGH (ref 6–22)
BUN: 33 mg/dL — ABNORMAL HIGH (ref 7–25)
CO2: 31 mmol/L (ref 20–32)
Calcium: 9.1 mg/dL (ref 8.6–10.4)
Chloride: 102 mmol/L (ref 98–110)
Creat: 1.36 mg/dL — ABNORMAL HIGH (ref 0.60–1.00)
Globulin: 2.9 g/dL (ref 1.9–3.7)
Glucose, Bld: 157 mg/dL — ABNORMAL HIGH (ref 65–99)
Potassium: 4.2 mmol/L (ref 3.5–5.3)
Sodium: 139 mmol/L (ref 135–146)
Total Bilirubin: 0.7 mg/dL (ref 0.2–1.2)
Total Protein: 6.9 g/dL (ref 6.1–8.1)
eGFR: 40 mL/min/{1.73_m2} — ABNORMAL LOW (ref >=60)

## 2024-03-23 LAB — HEMOGLOBIN A1C
Hgb A1c MFr Bld: 7.3 % — ABNORMAL HIGH (ref <5.7)
Mean Plasma Glucose: 163 mg/dL
eAG (mmol/L): 9 mmol/L

## 2024-03-23 LAB — TSH: TSH: 1.25 m[IU]/L (ref 0.40–4.50)

## 2024-03-24 ENCOUNTER — Encounter: Payer: Self-pay | Admitting: Student

## 2024-03-24 ENCOUNTER — Ambulatory Visit: Payer: Medicare PPO | Admitting: Student

## 2024-03-24 ENCOUNTER — Ambulatory Visit: Payer: Self-pay | Admitting: Student

## 2024-03-24 VITALS — BP 132/68 | HR 69 | Temp 98.0°F | Ht 60.0 in | Wt 195.0 lb

## 2024-03-24 DIAGNOSIS — I129 Hypertensive chronic kidney disease with stage 1 through stage 4 chronic kidney disease, or unspecified chronic kidney disease: Secondary | ICD-10-CM | POA: Diagnosis not present

## 2024-03-24 DIAGNOSIS — E113393 Type 2 diabetes mellitus with moderate nonproliferative diabetic retinopathy without macular edema, bilateral: Secondary | ICD-10-CM

## 2024-03-24 NOTE — Patient Instructions (Signed)
 VISIT SUMMARY:  Today, we reviewed your chronic kidney disease, diabetes management, and other health concerns. We discussed your recent lab results, current medications, and strategies to manage your conditions effectively.  YOUR PLAN:  -CHRONIC KIDNEY DISEASE, STAGE 3B: Chronic kidney disease stage 3B means your kidneys are moderately to severely damaged. We will continue your current medication, Invokana , to protect your kidneys. Please ensure you stay well-hydrated by drinking 6-8 glasses of liquid daily, preferably water, and avoid medications that can harm your kidneys. We will monitor your kidney function with lab tests every three months.  -TYPE 2 DIABETES MELLITUS WITH HYPERGLYCEMIA: Type 2 diabetes means your body has difficulty regulating blood sugar levels. Your recent A1c level was 7.3, which is higher than before. We will continue your current diabetes management with Invokana  and cinnamon. We will check your A1c again in three months. Consider making lifestyle changes to help control your blood sugar levels.  -HYPERTENSION: Hypertension means high blood pressure. Your blood pressure is well-controlled with your current medication. Keeping your blood pressure in the target range helps protect your kidneys and heart.  -ARTHRITIS: Arthritis is causing pain and swelling in your finger joint, along with changes to your fingernail. We recommend soaking your finger in Epsom salt to reduce inflammation.  INSTRUCTIONS:  Please follow up with lab tests every three months to monitor your kidney function and A1c levels. Continue your current medications and stay hydrated. Consider lifestyle changes to help manage your diabetes. If you have any new symptoms or concerns, please contact our office.

## 2024-03-25 ENCOUNTER — Encounter: Payer: Self-pay | Admitting: Student

## 2024-03-25 DIAGNOSIS — M25552 Pain in left hip: Secondary | ICD-10-CM | POA: Insufficient documentation

## 2024-03-25 DIAGNOSIS — S76012A Strain of muscle, fascia and tendon of left hip, initial encounter: Secondary | ICD-10-CM | POA: Insufficient documentation

## 2024-03-25 DIAGNOSIS — S76309A Unspecified injury of muscle, fascia and tendon of the posterior muscle group at thigh level, unspecified thigh, initial encounter: Secondary | ICD-10-CM | POA: Insufficient documentation

## 2024-03-25 DIAGNOSIS — S76302A Unspecified injury of muscle, fascia and tendon of the posterior muscle group at thigh level, left thigh, initial encounter: Secondary | ICD-10-CM | POA: Diagnosis not present

## 2024-03-25 NOTE — Progress Notes (Signed)
 Location:  [facility]: [facility] Provider: Jann Melody  Code Status: Full Code Goals of Care:     03/24/2024    1:47 PM  Advanced Directives  Does Patient Have a Medical Advance Directive? Yes  Type of Estate agent of [place];Out of facility DNR (pink MOST or yellow form);Living will  Does patient want to make changes to medical advance directive? No - Patient declined  Copy of Healthcare Power of Attorney in Chart? Yes - validated most recent copy scanned in chart (See row information)     Chief Complaint  Patient presents with   Medical Management of Chronic Issues    Medical Management of Chronic Issues. 3 Month follow up. Left Index finger cyst not healing. To discuss need for Zoster, AWV    HPI: Patient is a 78 y.o. female seen today for medical management of chronic diseases.   Discussed the use of AI scribe software for clinical note transcription with the patient, who gave verbal consent to proceed.  History of Present Illness   Cindy Lester is a 78 year old female with chronic kidney disease stage 3B and diabetes who presents for a follow-up on her kidney function and diabetes management.  Her kidney function has declined over the past ten years, with the most recent GFR at 40, indicating stage 3B chronic kidney disease. Previously, her GFR was 48 ten years ago and 53 six months ago. Minimal proteinuria was noted in February. She is currently on Invokana , an SGLT2 inhibitor, which she has been taking for several years to help protect her kidney function.  She has a history of diabetes, with her most recent A1c at 7.3, slightly higher than her previous reading of 6.7 in October. An incident occurred where her insulin  pump tubing broke, causing her blood sugar to spike to 500, which took time to stabilize. Her regimen includes cinnamon, calcium , and vitamin D  supplements.  She experiences persistent left eye twitching, which has been occurring  all day and feels like something is in it. Additionally, she has a recurring cyst on her finger that occasionally swells and becomes sore, affecting her fingernail growth. This has been ongoing for a year and a half.  Her social history includes drinking Diet Coke at lunch, Diet Sprite in the evening, and one cup of coffee in the morning. She also takes cholestyramine  for chronic diarrhea, which causes dry mouth as a side effect. No signs of dehydration in urine tests. No significant abnormalities in CO2 or electrolyte levels. No signs of anemia, normal white count, and platelets.         Past Medical History:  Diagnosis Date   Cataract    Colon polyps    Diabetes mellitus    Diverticulosis    GERD (gastroesophageal reflux disease)    occ-no meds   Hyperlipidemia    Hypertension    Neuropathy    Nonproliferative diabetic retinopathy of both eyes (HCC) 10/31/2020   Osteoarthritis    Tingling    neck   Wears glasses     Past Surgical History:  Procedure Laterality Date   ABDOMINAL HYSTERECTOMY  11/11/1985   ACDF     2021   COLONOSCOPY     GANGLION CYST EXCISION Right 03/17/2013   Procedure: EXCISION MASS RIGHT WRIST;  Surgeon: Kemp Patter, MD;  Location: [facility];  Service: Orthopedics;  Laterality: Right;   HAND SURGERY Right    right-as child  Allergies  Allergen Reactions   Codeine Nausea And Vomiting   Hydrocodone  Other (See Comments)    Dizzy   Hydrocodone -Acetaminophen  Itching   Oxycodone  Other (See Comments)    Dizzy   Penicillin G     Other Reaction(s): Unknown   Penicillins    Tape Other (See Comments)    Burns skin   Zoster Vaccine Live Other (See Comments)   Amoxicillin Rash    Outpatient Encounter Medications as of 03/24/2024  Medication Sig   amLODipine  (NORVASC ) 10 MG tablet Take 1 tablet (10 mg total) by mouth daily.   atorvastatin  (LIPITOR) 40 MG tablet Take 1 tablet (40 mg total) by mouth daily.   B Complex Vitamins (B  COMPLEX 50 PO) Take 1 tablet by mouth daily.   canagliflozin  (INVOKANA ) 100 MG TABS tablet Take 1 tablet (100 mg total) by mouth daily before breakfast.   Cholecalciferol (VITAMIN D3 PO) Take 2 capsules by mouth daily.   cholestyramine  (QUESTRAN ) 4 g packet Take 1 packet (4 g total) by mouth daily.   CINNAMON PO Take by mouth daily.   co-enzyme Q-10 50 MG capsule Take 100 mg by mouth daily.   etodolac  (LODINE ) 400 MG tablet Take 1 tablet (400 mg total) by mouth daily.   fish oil-omega-3 fatty acids 1000 MG capsule Take 2 g by mouth daily.   furosemide  (LASIX ) 40 MG tablet Take 1 tablet (40 mg total) by mouth daily.   glucose blood (ACCU-CHEK GUIDE TEST) test strip 1 each by Other route 4 (four) times daily -  before meals and at bedtime. Use as instructed   hydrochlorothiazide  (HYDRODIURIL ) 25 MG tablet Take 1 tablet (25 mg total) by mouth daily.   insulin  lispro (HUMALOG ) 100 UNIT/ML injection Inject 0.4 mLs (40 Units total) into the skin daily.   MAGNESIUM-ZINC PO Take by mouth daily.   metoprolol  succinate (TOPROL -XL) 50 MG 24 hr tablet Take 1 tablet (50 mg total) by mouth daily. Take with or immediately following a meal.   Multiple Vitamin (MULTIVITAMIN) capsule Take 1 capsule by mouth daily.     valsartan  (DIOVAN ) 320 MG tablet Take 1 tablet (320 mg total) by mouth daily.   vitamin B-12 (CYANOCOBALAMIN ) 1000 MCG tablet Take 1,000 mcg by mouth daily.   No facility-administered encounter medications on file as of 03/24/2024.    Review of Systems:  Review of Systems  Health Maintenance  Topic Date Due   Zoster Vaccines- Shingrix (1 of 2) 01/01/1965   OPHTHALMOLOGY EXAM  11/13/2022   Medicare Annual Wellness (AWV)  10/03/2023   COVID-19 Vaccine (10 - 2024-25 season) 04/16/2024   INFLUENZA VACCINE  06/11/2024   FOOT EXAM  09/09/2024   HEMOGLOBIN A1C  09/22/2024   Diabetic kidney evaluation - Urine ACR  12/17/2024   Diabetic kidney evaluation - eGFR measurement  03/22/2025    DTaP/Tdap/Td (2 - Td or Tdap) 08/30/2030   Pneumonia Vaccine 64+ Years old  Completed   DEXA SCAN  Completed   Hepatitis C Screening  Completed   HPV VACCINES  Aged Out   Meningococcal B Vaccine  Aged Out   Colonoscopy  Discontinued    Physical Exam: Vitals:   03/24/24 1342  BP: 132/68  Pulse: 69  Temp: 98 F (36.7 C)  SpO2: 98%  Weight: 195 lb (88.5 kg)  Height: 5' (1.524 m)   Body mass index is 38.08 kg/m. Physical Exam Constitutional:      Appearance: Normal appearance.  Cardiovascular:     Rate and  Rhythm: Normal rate and regular rhythm.     Pulses: Normal pulses.     Heart sounds: Normal heart sounds.  Pulmonary:     Effort: Pulmonary effort is normal.  Abdominal:     General: Abdomen is flat. Bowel sounds are normal.     Palpations: Abdomen is soft.  Musculoskeletal:        General: No swelling or tenderness.  Skin:    General: Skin is warm and dry.  Neurological:     Mental Status: She is alert and oriented to person, place, and time.     Gait: Gait normal.  Psychiatric:        Mood and Affect: Mood normal.    Physical Exam   VITALS: BP- 132/68      Labs reviewed: Basic Metabolic Panel: Recent Labs    09/11/23 0740 12/18/23 0814 03/22/24 0832  NA 138 139 139  K 4.3 4.4 4.2  CL 102 103 102  CO2 30 27 31   GLUCOSE 100* 94 157*  BUN 22 29* 33*  CREATININE 1.07* 1.16* 1.36*  CALCIUM  8.8  8.8 8.9 9.1  MG  --  2.3  --   TSH 1.83  --  1.25   Liver Function Tests: Recent Labs    09/11/23 0740 03/22/24 0832  AST 20 18  ALT 18 14  BILITOT 0.8 0.7  PROT 6.8 6.9   No results for input(s): "LIPASE", "AMYLASE" in the last 8760 hours. No results for input(s): "AMMONIA" in the last 8760 hours. CBC: Recent Labs    09/11/23 0740 03/22/24 0832  WBC 5.8 7.9  NEUTROABS 3,277 4,898  HGB 13.0 12.2  HCT 41.9 37.7  MCV 92.1 89.3  PLT 299 312   Lipid Panel: No results for input(s): "CHOL", "HDL", "LDLCALC", "TRIG", "CHOLHDL", "LDLDIRECT" in the  last 8760 hours. Lab Results  Component Value Date   HGBA1C 7.3 (H) 03/22/2024    Procedures since last visit: No results found. Results   LABS GFR: 40 (03/24/2024) Protein in urine: trace (12/2023) BUN: 33 (03/24/2024) Hb: within normal limits (03/24/2024) WBC: within normal limits (03/24/2024) PLT: within normal limits (03/24/2024) TSH: at goal (03/24/2024) A1c: 7.3 (03/24/2024) A1c: 6.7 (08/2023)      Assessment/Plan     Chronic Kidney Disease, Stage 3B Chronic kidney disease, stage 3B, with a decrease in GFR from 53 to 40 over six months. GFR was around 48 for the past ten years. No significant proteinuria. BUN is slightly elevated at 33, suggesting possible dehydration. No metabolic acidosis or electrolyte abnormalities. Current management includes SGLT2 inhibitor (Invokana ) for renal protection. Discussed hydration and avoiding nephrotoxic medications. Potential progression to stage 5 and dialysis was discussed, with a focus on preventing progression. - Continue SGLT2 inhibitor (Invokana ) for renal protection. - Ensure adequate hydration with 6-8 glasses of liquid daily, preferably water. - Avoid nephrotoxic medications. - Order quarterly lab tests to monitor kidney function.  Type 2 Diabetes Mellitus with Hyperglycemia Type 2 diabetes mellitus with recent A1c of 7.3, up from 6.7 in October. Discussed factors contributing to the increase, including recent travel and pump malfunction. Emphasized maintaining stable blood glucose levels. Current management includes Invokana  and cinnamon supplementation. Discussed cost and benefits of different SGLT2 inhibitors, with Invokana  chosen for cost considerations. - Continue current diabetes management with Invokana  and cinnamon. - Monitor A1c in three months to assess glucose control. - Discuss potential lifestyle modifications to improve glucose control.  Hypertension Hypertension is well-controlled with current medication regimen.  Blood pressure reading  today was 132/68, within the target range. Emphasized maintaining blood pressure control to protect kidney function and cardiovascular health.  Arthritis Arthritis with finger joint pain and nail changes. A cyst on the finger periodically swells and causes discomfort. No infection or systemic inflammation. Recommended conservative management with Epsom salt soaks to reduce inflammation. - Advise Epsom salt soaks for the affected finger to reduce inflammation.        Labs/tests ordered:  * No order type specified * Next appt:  06/25/2024

## 2024-03-26 DIAGNOSIS — S76311A Strain of muscle, fascia and tendon of the posterior muscle group at thigh level, right thigh, initial encounter: Secondary | ICD-10-CM | POA: Diagnosis not present

## 2024-04-09 DIAGNOSIS — M25552 Pain in left hip: Secondary | ICD-10-CM | POA: Diagnosis not present

## 2024-04-09 DIAGNOSIS — S76302D Unspecified injury of muscle, fascia and tendon of the posterior muscle group at thigh level, left thigh, subsequent encounter: Secondary | ICD-10-CM | POA: Diagnosis not present

## 2024-04-09 DIAGNOSIS — S76012D Strain of muscle, fascia and tendon of left hip, subsequent encounter: Secondary | ICD-10-CM | POA: Diagnosis not present

## 2024-04-09 DIAGNOSIS — M6281 Muscle weakness (generalized): Secondary | ICD-10-CM | POA: Diagnosis not present

## 2024-04-15 DIAGNOSIS — M6281 Muscle weakness (generalized): Secondary | ICD-10-CM | POA: Diagnosis not present

## 2024-04-15 DIAGNOSIS — M25552 Pain in left hip: Secondary | ICD-10-CM | POA: Diagnosis not present

## 2024-04-15 DIAGNOSIS — S76302D Unspecified injury of muscle, fascia and tendon of the posterior muscle group at thigh level, left thigh, subsequent encounter: Secondary | ICD-10-CM | POA: Diagnosis not present

## 2024-04-15 DIAGNOSIS — S76012D Strain of muscle, fascia and tendon of left hip, subsequent encounter: Secondary | ICD-10-CM | POA: Diagnosis not present

## 2024-04-22 DIAGNOSIS — M7918 Myalgia, other site: Secondary | ICD-10-CM | POA: Diagnosis not present

## 2024-04-22 DIAGNOSIS — M25552 Pain in left hip: Secondary | ICD-10-CM | POA: Diagnosis not present

## 2024-04-22 DIAGNOSIS — M1990 Unspecified osteoarthritis, unspecified site: Secondary | ICD-10-CM | POA: Insufficient documentation

## 2024-04-22 DIAGNOSIS — S76302D Unspecified injury of muscle, fascia and tendon of the posterior muscle group at thigh level, left thigh, subsequent encounter: Secondary | ICD-10-CM | POA: Diagnosis not present

## 2024-04-22 DIAGNOSIS — S76012D Strain of muscle, fascia and tendon of left hip, subsequent encounter: Secondary | ICD-10-CM | POA: Diagnosis not present

## 2024-04-22 DIAGNOSIS — M6281 Muscle weakness (generalized): Secondary | ICD-10-CM | POA: Diagnosis not present

## 2024-04-30 DIAGNOSIS — S76012D Strain of muscle, fascia and tendon of left hip, subsequent encounter: Secondary | ICD-10-CM | POA: Diagnosis not present

## 2024-04-30 DIAGNOSIS — M25552 Pain in left hip: Secondary | ICD-10-CM | POA: Diagnosis not present

## 2024-04-30 DIAGNOSIS — M6281 Muscle weakness (generalized): Secondary | ICD-10-CM | POA: Diagnosis not present

## 2024-04-30 DIAGNOSIS — S76302D Unspecified injury of muscle, fascia and tendon of the posterior muscle group at thigh level, left thigh, subsequent encounter: Secondary | ICD-10-CM | POA: Diagnosis not present

## 2024-05-03 DIAGNOSIS — S76302A Unspecified injury of muscle, fascia and tendon of the posterior muscle group at thigh level, left thigh, initial encounter: Secondary | ICD-10-CM | POA: Diagnosis not present

## 2024-05-05 DIAGNOSIS — M6281 Muscle weakness (generalized): Secondary | ICD-10-CM | POA: Diagnosis not present

## 2024-05-05 DIAGNOSIS — S76012D Strain of muscle, fascia and tendon of left hip, subsequent encounter: Secondary | ICD-10-CM | POA: Diagnosis not present

## 2024-05-05 DIAGNOSIS — M25552 Pain in left hip: Secondary | ICD-10-CM | POA: Diagnosis not present

## 2024-05-05 DIAGNOSIS — S76302D Unspecified injury of muscle, fascia and tendon of the posterior muscle group at thigh level, left thigh, subsequent encounter: Secondary | ICD-10-CM | POA: Diagnosis not present

## 2024-06-14 DIAGNOSIS — E782 Mixed hyperlipidemia: Secondary | ICD-10-CM | POA: Diagnosis not present

## 2024-06-14 DIAGNOSIS — I10 Essential (primary) hypertension: Secondary | ICD-10-CM | POA: Diagnosis not present

## 2024-06-14 DIAGNOSIS — S76302D Unspecified injury of muscle, fascia and tendon of the posterior muscle group at thigh level, left thigh, subsequent encounter: Secondary | ICD-10-CM | POA: Diagnosis not present

## 2024-06-14 DIAGNOSIS — M810 Age-related osteoporosis without current pathological fracture: Secondary | ICD-10-CM | POA: Diagnosis not present

## 2024-06-14 DIAGNOSIS — E119 Type 2 diabetes mellitus without complications: Secondary | ICD-10-CM | POA: Diagnosis not present

## 2024-06-14 DIAGNOSIS — M8589 Other specified disorders of bone density and structure, multiple sites: Secondary | ICD-10-CM | POA: Diagnosis not present

## 2024-06-21 DIAGNOSIS — I129 Hypertensive chronic kidney disease with stage 1 through stage 4 chronic kidney disease, or unspecified chronic kidney disease: Secondary | ICD-10-CM | POA: Diagnosis not present

## 2024-06-21 DIAGNOSIS — E1169 Type 2 diabetes mellitus with other specified complication: Secondary | ICD-10-CM | POA: Diagnosis not present

## 2024-06-21 LAB — MICROALBUMIN / CREATININE URINE RATIO
Creatinine, Urine: 89 mg/dL (ref 20–275)
Microalb Creat Ratio: 19 mg/g{creat} (ref <30)
Microalb, Ur: 1.7 mg/dL

## 2024-06-21 LAB — HEMOGLOBIN A1C
Hgb A1c MFr Bld: 7.2 % — ABNORMAL HIGH (ref <5.7)
Mean Plasma Glucose: 160 mg/dL
eAG (mmol/L): 8.9 mmol/L

## 2024-06-21 LAB — BASIC METABOLIC PANEL WITHOUT GFR
BUN: 25 mg/dL (ref 7–25)
CO2: 28 mmol/L (ref 20–32)
Calcium: 9 mg/dL (ref 8.6–10.4)
Chloride: 103 mmol/L (ref 98–110)
Creat: 0.98 mg/dL (ref 0.60–1.00)
Glucose, Bld: 152 mg/dL — ABNORMAL HIGH (ref 65–99)
Potassium: 4.2 mmol/L (ref 3.5–5.3)
Sodium: 139 mmol/L (ref 135–146)

## 2024-06-22 ENCOUNTER — Ambulatory Visit: Payer: Self-pay | Admitting: Student

## 2024-06-22 DIAGNOSIS — E113393 Type 2 diabetes mellitus with moderate nonproliferative diabetic retinopathy without macular edema, bilateral: Secondary | ICD-10-CM

## 2024-06-25 ENCOUNTER — Other Ambulatory Visit: Payer: Self-pay | Admitting: Student

## 2024-06-25 ENCOUNTER — Ambulatory Visit: Admitting: Student

## 2024-06-25 ENCOUNTER — Encounter: Payer: Self-pay | Admitting: Student

## 2024-06-25 VITALS — BP 136/64 | HR 64 | Temp 97.6°F | Ht 60.0 in | Wt 193.0 lb

## 2024-06-25 DIAGNOSIS — E1329 Other specified diabetes mellitus with other diabetic kidney complication: Secondary | ICD-10-CM | POA: Diagnosis not present

## 2024-06-25 DIAGNOSIS — S76309D Unspecified injury of muscle, fascia and tendon of the posterior muscle group at thigh level, unspecified thigh, subsequent encounter: Secondary | ICD-10-CM

## 2024-06-25 DIAGNOSIS — I10 Essential (primary) hypertension: Secondary | ICD-10-CM

## 2024-06-25 DIAGNOSIS — M4802 Spinal stenosis, cervical region: Secondary | ICD-10-CM

## 2024-06-25 DIAGNOSIS — E113393 Type 2 diabetes mellitus with moderate nonproliferative diabetic retinopathy without macular edema, bilateral: Secondary | ICD-10-CM

## 2024-06-25 DIAGNOSIS — I129 Hypertensive chronic kidney disease with stage 1 through stage 4 chronic kidney disease, or unspecified chronic kidney disease: Secondary | ICD-10-CM | POA: Diagnosis not present

## 2024-06-25 DIAGNOSIS — M1990 Unspecified osteoarthritis, unspecified site: Secondary | ICD-10-CM | POA: Diagnosis not present

## 2024-06-25 DIAGNOSIS — L851 Acquired keratosis [keratoderma] palmaris et plantaris: Secondary | ICD-10-CM | POA: Insufficient documentation

## 2024-06-25 MED ORDER — DOXAZOSIN MESYLATE 1 MG PO TABS: 1.0000 mg | ORAL_TABLET | Freq: Every day | ORAL | 1 refills | Status: DC

## 2024-06-25 NOTE — Patient Instructions (Signed)
 VISIT SUMMARY:  During today's visit, we reviewed your recent health concerns and made adjustments to your treatment plan. We discussed your diabetes management, blood pressure control, kidney health, osteopenia, back pain, hamstring injury recovery, chronic diarrhea, and osteoarthritis. We also talked about your upcoming travel plans and how they might affect your health.  YOUR PLAN:  -TYPE 2 DIABETES MELLITUS: Your A1c levels have increased, likely due to recent travel and dietary changes. Type 2 diabetes is a condition where your body does not use insulin  properly, leading to high blood sugar levels. Continue taking Invokana  to help manage your blood sugar and support kidney function.  -HYPERTENSION: Your blood pressure readings have been variable, with some readings above the target. Hypertension is high blood pressure, which can lead to serious health issues if not controlled. We will discontinue carvedilol  and continue metoprolol . Additionally, we will add doxazosin  1 mg at night. If your blood pressure remains uncontrolled after six months, we may refer you to the [facility].  -CHRONIC KIDNEY DISEASE: Your kidney function is well-managed. Chronic kidney disease means your kidneys are damaged and can't filter blood as well as they should. Continue taking Invokana  to support your kidney function.  -OSTEOPENIA: Your recent DEXA scan shows osteopenia, which is a condition where bone density is lower than normal but not low enough to be classified as osteoporosis. Continue taking calcium  and vitamin D  supplements and doing weight-bearing exercises. We may space out your DEXA scans to every three years unless there is a significant change in your health.  -CHRONIC LOW BACK PAIN WITH DEGENERATIVE CHANGES AND SCOLIOSIS: You have chronic low back pain due to bone spurs, compressed discs, and scoliosis. Scoliosis is a sideways curvature of the spine. Continue your strengthening  exercises for your spinal muscles and use a heating pad for muscle relaxation.  -HAMSTRING MUSCLE TEAR, RECOVERING: You are recovering from a hamstring tear that occurred 2-3 months ago. A hamstring tear is a strain or tear to the tendons or large muscles at the back of the thigh. Continue your gradual return to yoga and stretching exercises, and use ice and heat therapy for recovery after exercise.  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 Bile acid malabsorption is when your intestines can't properly absorb bile acids, leading to diarrhea. Continue taking cholestyramine  for management.  -GENERALIZED OSTEOARTHRITIS: You have generalized osteoarthritis, which is a condition that causes joint pain and stiffness. You have transitioned to using extra strength Tylenol  for pain management due to concerns about kidney health. Use extra strength Tylenol  as needed for pain management, up to two to three times a day.  INSTRUCTIONS:  Please continue with your current medications and follow the new adjustments we discussed. If your blood pressure remains uncontrolled after six months, we may refer you to the [facility]. Continue with your exercises and therapies as advised. If you have any new symptoms or concerns, please schedule a follow-up appointment.

## 2024-06-27 ENCOUNTER — Encounter: Payer: Self-pay | Admitting: Student

## 2024-06-27 NOTE — Progress Notes (Signed)
 [facility]: [facility] Provider: ABDUL  Code Status: Full Code Goals of Care:     06/25/2024   11:22 AM  Advanced Directives  Does Patient Have a Medical Advance Directive? Yes  Type of Estate agent of [place];Living will;Out of facility DNR (pink MOST or yellow form)  Does patient want to make changes to medical advance directive? No - Patient declined  Copy of Healthcare Power of Attorney in Chart? Yes - validated most recent copy scanned in chart (See row information)     Chief Complaint  Patient presents with   Medical Management of Chronic Issues    Medical Management of chronic Issues. 3 month follow up with labs.     HPI: Patient is a 78 y.o. female seen today for medical management of chronic diseases.   Discussed the use of AI scribe software for clinical note transcription with the patient, who gave verbal consent to proceed.  History of Present Illness   Cindy Lester is a 78 year old female with hypertension and diabetes who presents for a follow-up visit to discuss her blood pressure and recent lab results.  Her blood pressure has been fluctuating, with readings often around 140 mmHg, despite being on multiple antihypertensive medications including amlodipine , valsartan , metoprolol , hydrochlorothiazide , and furosemide . She is concerned about maintaining her blood pressure below 135 mmHg due to her history of diabetes.  She has a history of diabetes and notes that her A1c levels have increased recently. She is taking Invokana  to manage her blood sugar and kidney function. She is concerned about her blood sugar levels, especially with her upcoming travel plans.  She experienced a hamstring injury while vacuuming two to three months ago. She reports that an MRI performed at the time revealed a tear greater than 50%, according to the medical professional who reviewed her imaging. She has resumed yoga cautiously after returning  from a trip. She uses ice and heat therapy to manage discomfort and is focusing on strengthening her back muscles.  She has a history of osteopenia and recently underwent a DEXA scan, which showed a 14% chance of hip fracture in the next ten years. She takes calcium  and vitamin D  supplements and engages in weight-bearing exercises.  She has a history of back issues, including bone spurs, compressed discs, and scoliosis. She experiences numbness in her arms when sleeping with her head elevated and finds relief by sleeping flat. She underwent ACDF surgery previously and reports that she was told by her surgeon that no further surgery is needed at this time.  She has stopped taking Titralac due to concerns about kidney health and now uses extra strength Tylenol  for arthritis pain, taking two pills a day as needed. This change has been manageable and she has not experienced significant pain without Titralac.  She reports dry mouth at night and occasional dizziness when standing. Thought to be due to diuretic therapy.         Past Medical History:  Diagnosis Date   Cataract    Colon polyps    Diabetes mellitus    Diverticulosis    GERD (gastroesophageal reflux disease)    occ-no meds   Hyperlipidemia    Hypertension    Neuropathy    Nonproliferative diabetic retinopathy of both eyes (HCC) 10/31/2020   Osteoarthritis    Tingling    neck   Wears glasses     Past Surgical History:  Procedure Laterality Date  ABDOMINAL HYSTERECTOMY  11/11/1985   ACDF     2021   COLONOSCOPY     GANGLION CYST EXCISION Right 03/17/2013   Procedure: EXCISION MASS RIGHT WRIST;  Surgeon: Arley JONELLE Curia, MD;  Location: [facility];  Service: Orthopedics;  Laterality: Right;   HAND SURGERY Right    right-as child    Allergies  Allergen Reactions   Codeine Nausea And Vomiting   Hydrocodone  Other (See Comments)    Dizzy   Hydrocodone -Acetaminophen  Itching   Oxycodone  Other (See Comments)     Dizzy   Penicillin G     Other Reaction(s): Unknown   Penicillins    Tape Other (See Comments)    Burns skin   Zoster Vaccine Live Other (See Comments)   Amoxicillin Rash    Outpatient Encounter Medications as of 06/25/2024  Medication Sig   amLODipine  (NORVASC ) 10 MG tablet Take 1 tablet (10 mg total) by mouth daily.   atorvastatin  (LIPITOR) 40 MG tablet Take 1 tablet (40 mg total) by mouth daily.   B Complex Vitamins (B COMPLEX 50 PO) Take 1 tablet by mouth daily.   canagliflozin  (INVOKANA ) 100 MG TABS tablet Take 1 tablet (100 mg total) by mouth daily before breakfast.   Cholecalciferol (VITAMIN D3 PO) Take 2 capsules by mouth daily.   cholestyramine  (QUESTRAN ) 4 g packet Take 1 packet (4 g total) by mouth daily.   CINNAMON PO Take by mouth daily.   co-enzyme Q-10 50 MG capsule Take 100 mg by mouth daily.   doxazosin  (CARDURA ) 1 MG tablet Take 1 tablet (1 mg total) by mouth daily.   fish oil-omega-3 fatty acids 1000 MG capsule Take 2 g by mouth daily.   furosemide  (LASIX ) 40 MG tablet Take 1 tablet (40 mg total) by mouth daily.   glucose blood (ACCU-CHEK GUIDE TEST) test strip 1 each by Other route 4 (four) times daily -  before meals and at bedtime. Use as instructed   insulin  lispro (HUMALOG ) 100 UNIT/ML injection Inject 0.4 mLs (40 Units total) into the skin daily.   Insulin  Pump Accessories MISC BASAL RATE 30 UNITS/DAY,DISPOSABLE SUBCUTANEOUS   MAGNESIUM-ZINC PO Take by mouth daily.   metoprolol  succinate (TOPROL -XL) 50 MG 24 hr tablet Take 1 tablet (50 mg total) by mouth daily. Take with or immediately following a meal.   Multiple Vitamin (MULTIVITAMIN) capsule Take 1 capsule by mouth daily.     valsartan  (DIOVAN ) 320 MG tablet Take 1 tablet (320 mg total) by mouth daily.   vitamin B-12 (CYANOCOBALAMIN ) 1000 MCG tablet Take 1,000 mcg by mouth daily.   [DISCONTINUED] etodolac  (LODINE ) 400 MG tablet Take 1 tablet (400 mg total) by mouth daily.   [DISCONTINUED]  hydrochlorothiazide  (HYDRODIURIL ) 25 MG tablet Take 1 tablet (25 mg total) by mouth daily.   No facility-administered encounter medications on file as of 06/25/2024.    Review of Systems:  Review of Systems  Health Maintenance  Topic Date Due   Zoster Vaccines- Shingrix (1 of 2) 01/01/1965   OPHTHALMOLOGY EXAM  11/13/2022   Medicare Annual Wellness (AWV)  10/03/2023   COVID-19 Vaccine (10 - 2024-25 season) 04/16/2024   INFLUENZA VACCINE  06/11/2024   FOOT EXAM  09/09/2024   HEMOGLOBIN A1C  12/22/2024   Diabetic kidney evaluation - eGFR measurement  06/21/2025   Diabetic kidney evaluation - Urine ACR  06/21/2025   DTaP/Tdap/Td (2 - Td or Tdap) 08/30/2030   Pneumococcal Vaccine: 50+ Years  Completed   DEXA SCAN  Completed  Hepatitis C Screening  Completed   HPV VACCINES  Aged Out   Meningococcal B Vaccine  Aged Out   Pneumococcal Vaccine  Discontinued   Colonoscopy  Discontinued    Physical Exam: Vitals:   06/25/24 1118  BP: 136/64  Pulse: 64  Temp: 97.6 F (36.4 C)  SpO2: 97%  Weight: 193 lb (87.5 kg)  Height: 5' (1.524 m)   Body mass index is 37.69 kg/m. Physical Exam Constitutional:      Appearance: Normal appearance.  Cardiovascular:     Rate and Rhythm: Normal rate and regular rhythm.     Pulses: Normal pulses.     Heart sounds: Normal heart sounds.  Pulmonary:     Effort: Pulmonary effort is normal.  Abdominal:     General: Abdomen is flat. Bowel sounds are normal.     Palpations: Abdomen is soft.  Musculoskeletal:        General: No swelling or tenderness.  Skin:    General: Skin is warm and dry.  Neurological:     Mental Status: She is alert and oriented to person, place, and time.     Gait: Gait normal.  Psychiatric:        Mood and Affect: Mood normal.    Physical Exam          Labs reviewed: Basic Metabolic Panel: Recent Labs    09/11/23 0740 12/18/23 0814 03/22/24 0832 06/21/24 0740  NA 138 139 139 139  K 4.3 4.4 4.2 4.2  CL  102 103 102 103  CO2 30 27 31 28   GLUCOSE 100* 94 157* 152*  BUN 22 29* 33* 25  CREATININE 1.07* 1.16* 1.36* 0.98  CALCIUM  8.8  8.8 8.9 9.1 9.0  MG  --  2.3  --   --   TSH 1.83  --  1.25  --    Liver Function Tests: Recent Labs    09/11/23 0740 03/22/24 0832  AST 20 18  ALT 18 14  BILITOT 0.8 0.7  PROT 6.8 6.9   No results for input(s): LIPASE, AMYLASE in the last 8760 hours. No results for input(s): AMMONIA in the last 8760 hours. CBC: Recent Labs    09/11/23 0740 03/22/24 0832  WBC 5.8 7.9  NEUTROABS 3,277 4,898  HGB 13.0 12.2  HCT 41.9 37.7  MCV 92.1 89.3  PLT 299 312   Lipid Panel: No results for input(s): CHOL, HDL, LDLCALC, TRIG, CHOLHDL, LDLDIRECT in the last 8760 hours. Lab Results  Component Value Date   HGBA1C 7.2 (H) 06/21/2024    Procedures since last visit: No results found. Results   LABS Microalbumin: 19 mg/L  RADIOLOGY Hamstring MRI: tear greater than 50% DEXA scan: osteopenia, 14% risk of hip fracture in 10 years Back X-ray: osteophytes, compressed discs, scoliosis Cervical spine MRI: not indicated for surgery (10/2023)      Assessment/Plan     Hypertension with multiple antihypertensive agents and medication adjustment Hypertension remains uncontrolled with inconsistent blood pressure readings, some above target. Current medications include amlodipine , furosemide , hydrochlorothiazide , valsartan , and metoprolol . Concerns about interactions and side effects led to the decision to try doxazosin  instead of spironolactone and Imdur. - Prescribe doxazosin  1 mg daily - Monitor blood pressure regularly - Consider referral to hypertension clinic if blood pressure remains uncontrolled after six months  Type 2 diabetes mellitus with suboptimal glycemic control Suboptimal glycemic control with recent increase in A1c. Current medication includes Invokana , beneficial for kidney function.  Chronic kidney disease, unspecified  stage Chronic  kidney disease is well-managed with current medications including Invokana , furosemide , and valsartan , which are beneficial for kidney health.  Osteopenia with fracture risk assessment Osteopenia with a 14% risk of hip fracture in the next ten years. Recent DEXA scan shows stable osteopenia. Current management includes weight-bearing exercises, calcium , and vitamin D  supplementation. - Extend DEXA scan interval to every three years unless significant change in health status  Chronic pain due to osteoarthritis Chronic pain managed with extra strength Tylenol  as needed. Previously used Titralac, but discontinued due to potential kidney damage.  Cervical spondylosis with radiculopathy and history of cervical spine surgery Cervical spondylosis with radiculopathy managed conservatively. Previous ACDF surgery with no indication for further surgical intervention. Symptoms managed with positional adjustments and strengthening exercises.  Hamstring muscle tear, recovering Recovering from a hamstring muscle tear sustained two to three months ago. MRI showed more than 50% tear. Recovery includes stretching and gradual return to activities like yoga. Ice and heat therapy used for recovery.  Cholestyramine -dependent chronic diarrhea Chronic diarrhea managed with cholestyramine . Decided against adding spironolactone due to potential metabolic acidosis and interaction concerns.      Labs/tests ordered:  * No order type specified * Next appt:  06/25/2024

## 2024-06-28 ENCOUNTER — Encounter: Payer: Self-pay | Admitting: Student

## 2024-06-28 DIAGNOSIS — E113393 Type 2 diabetes mellitus with moderate nonproliferative diabetic retinopathy without macular edema, bilateral: Secondary | ICD-10-CM

## 2024-06-28 MED ORDER — INVOKANA 100 MG PO TABS: 100.0000 mg | ORAL_TABLET | Freq: Every day | ORAL | 3 refills | Status: DC

## 2024-06-29 ENCOUNTER — Other Ambulatory Visit: Payer: Self-pay | Admitting: Student

## 2024-06-29 DIAGNOSIS — E113393 Type 2 diabetes mellitus with moderate nonproliferative diabetic retinopathy without macular edema, bilateral: Secondary | ICD-10-CM

## 2024-06-29 NOTE — Telephone Encounter (Signed)
 Note sent to patient regarding Brand of Insulin  Pump.Awaiting response.

## 2024-06-29 NOTE — Telephone Encounter (Signed)
 Patient returned call and provided the name of pump: Medtronic Mini Med Pump, information was added to rx and re-submitted

## 2024-06-29 NOTE — Telephone Encounter (Signed)
 Left message on voicemail for patient to return call when available, reason for call: Message from pharmacy we need to add the name of insulin  pump to RX

## 2024-07-19 ENCOUNTER — Encounter: Payer: Self-pay | Admitting: Student

## 2024-07-19 MED ORDER — COVID-19 MRNA VACCINE (PFIZER) 30 MCG/0.3ML IM SUSP: 0.3000 mL | Freq: Once | INTRAMUSCULAR | 0 refills | Status: AC

## 2024-07-21 NOTE — Telephone Encounter (Signed)
 Attn: May, Anita A,   Can you please add the name of my insulin  pump to the [employer]. Per [facility] and then resend it.  Pump: Medtronic Minimed   Please advise

## 2024-07-24 ENCOUNTER — Encounter: Payer: Self-pay | Admitting: Student

## 2024-07-26 NOTE — Telephone Encounter (Signed)
 Continue to check your blood pressure and if the SBPs are mostly in the 150s then pls follow up in clinic and bring BP log. Thanks.

## 2024-07-27 ENCOUNTER — Other Ambulatory Visit: Payer: Self-pay | Admitting: Adult Health

## 2024-07-27 MED ORDER — NIRMATRELVIR/RITONAVIR (PAXLOVID)TABLET
3.0000 | ORAL_TABLET | Freq: Two times a day (BID) | ORAL | 0 refills | Status: AC
Start: 2024-07-27 — End: 2024-08-01

## 2024-07-27 NOTE — Telephone Encounter (Signed)
 This is the recommendation if and when you take Paxlovid .  If Amlodipine  can be halved ;  take 5 mg every day & monitor BP. If BP < 100/70, hold it completely.. Would stop any supplements which are not prescription as Paxlovid  interacts with some supplements such as garlic. It may also increase affect of any sedatives or sleeping meds.   2.   Hold Atorvastatin  while on Paxlovid  and 3 days after taking Paxlovid  (total of 8 days).  I will send prescription to pharmacy and pls confirm that you will follow these recommendations.  Thanks.  Skii Cleland Medina-Vargas NP

## 2024-09-06 ENCOUNTER — Other Ambulatory Visit: Payer: Self-pay | Admitting: Internal Medicine

## 2024-09-06 DIAGNOSIS — Z1231 Encounter for screening mammogram for malignant neoplasm of breast: Secondary | ICD-10-CM

## 2024-09-10 ENCOUNTER — Other Ambulatory Visit: Payer: Self-pay | Admitting: *Deleted

## 2024-09-10 DIAGNOSIS — E113393 Type 2 diabetes mellitus with moderate nonproliferative diabetic retinopathy without macular edema, bilateral: Secondary | ICD-10-CM

## 2024-09-10 DIAGNOSIS — I129 Hypertensive chronic kidney disease with stage 1 through stage 4 chronic kidney disease, or unspecified chronic kidney disease: Secondary | ICD-10-CM

## 2024-09-10 DIAGNOSIS — I10 Essential (primary) hypertension: Secondary | ICD-10-CM

## 2024-09-10 DIAGNOSIS — E539 Vitamin B deficiency, unspecified: Secondary | ICD-10-CM

## 2024-09-21 ENCOUNTER — Ambulatory Visit: Payer: Self-pay | Admitting: Orthopedic Surgery

## 2024-09-21 LAB — LIPID PANEL
Cholesterol: 140 mg/dL (ref <200)
HDL: 78 mg/dL (ref >=50)
LDL Cholesterol (Calc): 46 mg/dL
Non-HDL Cholesterol (Calc): 62 mg/dL (ref <130)
Total CHOL/HDL Ratio: 1.8 (calc) (ref <5.0)
Triglycerides: 77 mg/dL (ref <150)

## 2024-09-21 LAB — COMPLETE METABOLIC PANEL WITHOUT GFR
AG Ratio: 1.5 (calc) (ref 1.0–2.5)
ALT: 12 U/L (ref 6–29)
AST: 14 U/L (ref 10–35)
Albumin: 4 g/dL (ref 3.6–5.1)
Alkaline phosphatase (APISO): 51 U/L (ref 37–153)
BUN/Creatinine Ratio: 19 (calc) (ref 6–22)
BUN: 25 mg/dL (ref 7–25)
CO2: 29 mmol/L (ref 20–32)
Calcium: 9.2 mg/dL (ref 8.6–10.4)
Chloride: 99 mmol/L (ref 98–110)
Creat: 1.31 mg/dL — ABNORMAL HIGH (ref 0.60–1.00)
Globulin: 2.7 g/dL (ref 1.9–3.7)
Glucose, Bld: 360 mg/dL — ABNORMAL HIGH (ref 65–139)
Potassium: 5 mmol/L (ref 3.5–5.3)
Sodium: 135 mmol/L (ref 135–146)
Total Bilirubin: 0.6 mg/dL (ref 0.2–1.2)
Total Protein: 6.7 g/dL (ref 6.1–8.1)

## 2024-09-21 LAB — CBC WITH DIFFERENTIAL/PLATELET
Absolute Lymphocytes: 1450 {cells}/uL (ref 850–3900)
Absolute Monocytes: 554 {cells}/uL (ref 200–950)
Basophils Absolute: 62 {cells}/uL (ref 0–200)
Basophils Relative: 1.1 %
Eosinophils Absolute: 129 {cells}/uL (ref 15–500)
Eosinophils Relative: 2.3 %
HCT: 33.2 % — ABNORMAL LOW (ref 35.0–45.0)
Hemoglobin: 10.5 g/dL — ABNORMAL LOW (ref 11.7–15.5)
MCH: 29.9 pg (ref 27.0–33.0)
MCHC: 31.6 g/dL — ABNORMAL LOW (ref 32.0–36.0)
MCV: 94.6 fL (ref 80.0–100.0)
MPV: 9.6 fL (ref 7.5–12.5)
Monocytes Relative: 9.9 %
Neutro Abs: 3405 {cells}/uL (ref 1500–7800)
Neutrophils Relative %: 60.8 %
Platelets: 283 Thousand/uL (ref 140–400)
RBC: 3.51 Million/uL — ABNORMAL LOW (ref 3.80–5.10)
RDW: 12.2 % (ref 11.0–15.0)
Total Lymphocyte: 25.9 %
WBC: 5.6 Thousand/uL (ref 3.8–10.8)

## 2024-09-21 LAB — TSH: TSH: 1.24 m[IU]/L (ref 0.40–4.50)

## 2024-09-21 LAB — HEMOGLOBIN A1C
Hgb A1c MFr Bld: 7.1 % — ABNORMAL HIGH (ref <5.7)
Mean Plasma Glucose: 157 mg/dL
eAG (mmol/L): 8.7 mmol/L

## 2024-09-24 ENCOUNTER — Non-Acute Institutional Stay: Payer: Self-pay | Admitting: Orthopedic Surgery

## 2024-09-24 ENCOUNTER — Encounter: Payer: Self-pay | Admitting: Orthopedic Surgery

## 2024-09-24 VITALS — BP 124/68 | HR 69 | Temp 97.6°F | Resp 20 | Ht 60.0 in | Wt 194.2 lb

## 2024-09-24 DIAGNOSIS — E113393 Type 2 diabetes mellitus with moderate nonproliferative diabetic retinopathy without macular edema, bilateral: Secondary | ICD-10-CM | POA: Diagnosis not present

## 2024-09-24 DIAGNOSIS — K909 Intestinal malabsorption, unspecified: Secondary | ICD-10-CM

## 2024-09-24 DIAGNOSIS — D649 Anemia, unspecified: Secondary | ICD-10-CM

## 2024-09-24 DIAGNOSIS — E1169 Type 2 diabetes mellitus with other specified complication: Secondary | ICD-10-CM | POA: Diagnosis not present

## 2024-09-24 DIAGNOSIS — N1832 Chronic kidney disease, stage 3b: Secondary | ICD-10-CM

## 2024-09-24 DIAGNOSIS — I10 Essential (primary) hypertension: Secondary | ICD-10-CM

## 2024-09-24 DIAGNOSIS — Z794 Long term (current) use of insulin: Secondary | ICD-10-CM

## 2024-09-24 DIAGNOSIS — R197 Diarrhea, unspecified: Secondary | ICD-10-CM

## 2024-09-24 DIAGNOSIS — E785 Hyperlipidemia, unspecified: Secondary | ICD-10-CM

## 2024-09-24 MED ORDER — ATORVASTATIN CALCIUM 40 MG PO TABS: 40.0000 mg | ORAL_TABLET | Freq: Every day | ORAL | 2 refills | Status: AC

## 2024-09-24 MED ORDER — VITAMIN B-12 1000 MCG PO TABS: 1000.0000 ug | ORAL_TABLET | Freq: Every day | ORAL | 2 refills | Status: AC

## 2024-09-24 MED ORDER — METOPROLOL SUCCINATE ER 50 MG PO TB24: 50.0000 mg | ORAL_TABLET | Freq: Every day | ORAL | 2 refills | Status: AC

## 2024-09-24 MED ORDER — AMLODIPINE BESYLATE 10 MG PO TABS: 10.0000 mg | ORAL_TABLET | Freq: Every day | ORAL | 2 refills | Status: AC

## 2024-09-24 MED ORDER — DOXAZOSIN MESYLATE 1 MG PO TABS: 1.0000 mg | ORAL_TABLET | Freq: Every day | ORAL | 2 refills | Status: AC

## 2024-09-24 MED ORDER — FUROSEMIDE 40 MG PO TABS: 40.0000 mg | ORAL_TABLET | Freq: Every day | ORAL | 2 refills | Status: AC

## 2024-09-24 MED ORDER — INSULIN LISPRO 100 UNIT/ML IJ SOLN: 40.0000 [IU] | Freq: Every day | INTRAMUSCULAR | 3 refills | Status: DC

## 2024-09-24 MED ORDER — VALSARTAN 320 MG PO TABS: 320.0000 mg | ORAL_TABLET | Freq: Every day | ORAL | 2 refills | Status: AC

## 2024-09-24 MED ORDER — CHOLESTYRAMINE 4 G PO PACK: 4.0000 g | PACK | Freq: Every day | ORAL | 2 refills | Status: AC

## 2024-09-24 MED ORDER — INVOKANA 100 MG PO TABS
100.0000 mg | ORAL_TABLET | Freq: Every day | ORAL | 2 refills | Status: AC
Start: 2024-09-24 — End: ?

## 2024-09-24 NOTE — Patient Instructions (Addendum)
 1.Stop at check out & schedule Annual Wellness Visit.  Consider Caltrate for bone health> it has calcium  and vitamin D   2.Fasting Labs 01-25-2024

## 2024-09-26 NOTE — Progress Notes (Signed)
 Location:   [employer] of Service:   [facility] Provider:  Greig FORBES Cluster, NP   Cluster Greig FORBES, NP  Patient Care Team: Cluster Greig FORBES, NP as PCP - General (Adult Health Nurse Practitioner) Elner Arley LABOR, MD as Consulting Physician (Ophthalmology)  Extended Emergency Contact Information Primary Emergency Contact: [facility] Address: [address]          [place], [place] [address] [place] Home Phone: [phone] Mobile Phone: [phone] Relation: Spouse  Code Status:  Full code Goals of care: Advanced Directive information    09/24/2024   11:15 AM  Advanced Directives  Does Patient Have a Medical Advance Directive? Yes  Type of Estate Agent of [place];Living will  Does patient want to make changes to medical advance directive? No - Patient declined  Copy of Healthcare Power of Attorney in Chart? Yes - validated most recent copy scanned in chart (See row information)     Chief Complaint  Patient presents with   Medical Management of Chronic Issues    3 Month follow up.    HPI:  Pt is a 78 y.o. female seen today for medical management of chronic diseases.    Discussed the use of AI scribe software for clinical note transcription with the patient, who gave verbal consent to proceed.  History of Present Illness   Cindy Lester is a 78 year old female with diabetes and hypertension who presents for a follow-up visit.  Recent labs reviewed with patient.   She has a long-standing history of diabetes, diagnosed at age 12, and has been using an insulin  pump for approximately 25 years. She monitors her blood sugar 4-5 times daily, noting fluctuations with occasional high readings of 460-480 mg/dL. She uses Humalog  in her pump and adjusts her insulin  based on a scale to maintain blood sugar around 100 mg/dL. Her A1c is currently 7.1%. She also takes Invokana , which will become more expensive next year.  She  experiences chronic diarrhea, which she manages with cholestyramine . She reported that her doctor thought this was due to diabetes slowing down her bowels. She has tried other over-the-counter remedies without success.   Her blood pressure has been variable over the past year despite medication adjustments. She currently takes amlodipine  and furosemide , with occasional leg swelling, especially when sitting or on an airplane. Recent blood pressure readings are around 124/68 mmHg.  She has a history of essential tremor, noticing shaking in her head and hands. She is currently taking metoprolol , which she believes helps with the tremor.  She underwent ACDF surgery in 2021 and continues to experience her arms going to sleep, particularly at night, which she can alleviate by moving her neck.  She maintains good foot health with no neuropathy, despite a history of going barefoot often. No recent falls or accidents. She engages in regular physical activity, including walking and yoga four days a week.        Past Medical History:  Diagnosis Date   Cataract    Colon polyps    Diabetes mellitus    Diverticulosis    GERD (gastroesophageal reflux disease)    occ-no meds   Hyperlipidemia    Hypertension    Neuropathy    Nonproliferative diabetic retinopathy of both eyes (HCC) 10/31/2020   Osteoarthritis    Tingling    neck   Wears glasses    Past Surgical History:  Procedure Laterality Date   ABDOMINAL HYSTERECTOMY  11/11/1985   ACDF     2021   COLONOSCOPY     GANGLION CYST EXCISION Right 03/17/2013   Procedure: EXCISION MASS RIGHT WRIST;  Surgeon: Arley JONELLE Curia, MD;  Location: [facility];  Service: Orthopedics;  Laterality: Right;   HAND SURGERY Right    right-as child    Allergies  Allergen Reactions   Codeine Nausea And Vomiting   Hydrocodone  Other (See Comments)    Dizzy   Hydrocodone -Acetaminophen  Itching   Oxycodone  Other (See Comments)    Dizzy   Penicillin  G     Other Reaction(s): Unknown   Penicillins    Tape Other (See Comments)    [place] skin   Zoster Vaccine Live Other (See Comments)   Amoxicillin Rash    Outpatient Encounter Medications as of 09/24/2024  Medication Sig   B Complex Vitamins (B COMPLEX 50 PO) Take 1 tablet by mouth daily.   Cholecalciferol (VITAMIN D3 PO) Take 2 capsules by mouth daily.   CINNAMON PO Take by mouth daily.   co-enzyme Q-10 50 MG capsule Take 100 mg by mouth daily.   fish oil-omega-3 fatty acids 1000 MG capsule Take 2 g by mouth daily.   glucose blood (ACCU-CHEK GUIDE TEST) test strip 1 each by Other route 4 (four) times daily -  before meals and at bedtime. Use as instructed   Insulin  Pump Accessories MISC BASAL RATE 30 UNITS/DAY,DISPOSABLE SUBCUTANEOUS Pump: Medtronic Minimed   MAGNESIUM-ZINC PO Take by mouth daily.   Multiple Vitamin (MULTIVITAMIN) capsule Take 1 capsule by mouth daily.     [DISCONTINUED] amLODipine  (NORVASC ) 10 MG tablet Take 1 tablet (10 mg total) by mouth daily.   [DISCONTINUED] atorvastatin  (LIPITOR) 40 MG tablet Take 1 tablet (40 mg total) by mouth daily.   [DISCONTINUED] canagliflozin  (INVOKANA ) 100 MG TABS tablet Take 1 tablet (100 mg total) by mouth daily before breakfast.   [DISCONTINUED] cholestyramine  (QUESTRAN ) 4 g packet Take 1 packet (4 g total) by mouth daily.   [DISCONTINUED] doxazosin  (CARDURA ) 1 MG tablet Take 1 tablet (1 mg total) by mouth daily.   [DISCONTINUED] furosemide  (LASIX ) 40 MG tablet Take 1 tablet (40 mg total) by mouth daily.   [DISCONTINUED] insulin  lispro (HUMALOG ) 100 UNIT/ML injection Inject 0.4 mLs (40 Units total) into the skin daily. Uses insulin  pump Medtronic Mini Med Brand   [DISCONTINUED] valsartan  (DIOVAN ) 320 MG tablet Take 1 tablet (320 mg total) by mouth daily.   [DISCONTINUED] vitamin B-12 (CYANOCOBALAMIN ) 1000 MCG tablet Take 1,000 mcg by mouth daily.   amLODipine  (NORVASC ) 10 MG tablet Take 1 tablet (10 mg total) by mouth daily.    atorvastatin  (LIPITOR) 40 MG tablet Take 1 tablet (40 mg total) by mouth daily.   canagliflozin  (INVOKANA ) 100 MG TABS tablet Take 1 tablet (100 mg total) by mouth daily before breakfast.   cholestyramine  (QUESTRAN ) 4 g packet Take 1 packet (4 g total) by mouth daily.   cyanocobalamin  (VITAMIN B12) 1000 MCG tablet Take 1 tablet (1,000 mcg total) by mouth daily.   doxazosin  (CARDURA ) 1 MG tablet Take 1 tablet (1 mg total) by mouth daily.   furosemide  (LASIX ) 40 MG tablet Take 1 tablet (40 mg total) by mouth daily.   insulin  lispro (HUMALOG ) 100 UNIT/ML injection Inject 0.4 mLs (40 Units total) into the skin daily. Uses insulin  pump Medtronic Mini Med Brand   metoprolol  succinate (TOPROL -XL) 50 MG 24 hr tablet Take 1 tablet (50 mg total) by mouth daily. Take with or immediately following  a meal.   valsartan  (DIOVAN ) 320 MG tablet Take 1 tablet (320 mg total) by mouth daily.   [DISCONTINUED] metoprolol  succinate (TOPROL -XL) 50 MG 24 hr tablet Take 1 tablet (50 mg total) by mouth daily. Take with or immediately following a meal. (Patient not taking: Reported on 09/24/2024)   No facility-administered encounter medications on file as of 09/24/2024.    Review of Systems  Constitutional: Negative.   HENT: Negative.    Eyes: Negative.   Respiratory: Negative.    Cardiovascular: Negative.   Gastrointestinal:  Positive for constipation and diarrhea.  Endocrine: Negative.   Genitourinary: Negative.   Musculoskeletal:  Positive for back pain and myalgias.  Skin: Negative.   Allergic/Immunologic: Negative.   Neurological:  Positive for numbness. Negative for weakness.  Hematological: Negative.   Psychiatric/Behavioral: Negative.      Immunization History  Administered Date(s) Administered   Influenza Split 10/27/2009, 09/14/2010, 08/02/2011, 08/16/2011, 08/05/2012, 08/10/2012, 08/08/2014, 08/30/2020, 07/12/2022   Influenza, Quadrivalent, Recombinant, Inj, Pf 07/29/2018, 07/15/2019, 07/28/2020    Influenza-Unspecified 07/13/2023, 07/19/2024   Moderna Covid-19 Fall Seasonal Vaccine 63yrs & older 07/21/2023   PFIZER(Purple Top)SARS-COV-2 Vaccination 12/17/2019, 01/11/2020, 08/30/2020, 02/19/2021, 03/11/2022   Pfizer Covid-19 Vaccine Bivalent Booster 98yrs & up 08/06/2021, 05/06/2022, 05/06/2022   Pneumococcal Conjugate-13 02/23/2014   Pneumococcal Polysaccharide-23 12/22/1998, 11/16/2010, 01/06/2012   RSV,unspecified 07/08/2022   Tdap 08/30/2020   Unspecified SARS-COV-2 Vaccination 02/20/2024, 07/23/2024   Zoster, Live 07/30/2011, 10/24/2011   Pertinent  Health Maintenance Due  Topic Date Due   OPHTHALMOLOGY EXAM  12/01/2024   HEMOGLOBIN A1C  03/20/2025   FOOT EXAM  09/24/2025   Influenza Vaccine  Completed   DEXA SCAN  Completed   Mammogram  Discontinued   Colonoscopy  Discontinued      10/08/2023    3:40 PM 03/24/2024    1:46 PM 06/25/2024   11:21 AM 09/24/2024   11:15 AM  Fall Risk  Falls in the past year? 0 0 0 0  Was there an injury with Fall?  0 0 0  Fall Risk Category Calculator  0 0 0  Patient at Risk for Falls Due to  No Fall Risks No Fall Risks No Fall Risks  Fall risk Follow up  Falls evaluation completed Falls evaluation completed Falls evaluation completed   Functional Status Survey:    Vitals:   09/24/24 1120  BP: 124/68  Pulse: 69  Resp: 20  Temp: 97.6 F (36.4 C)  SpO2: 97%  Weight: 194 lb 3.2 oz (88.1 kg)  Height: 5' (1.524 m)   Body mass index is 37.93 kg/m. Physical Exam Vitals reviewed.  Constitutional:      General: She is not in acute distress. HENT:     Head: Normocephalic.  Eyes:     General:        Right eye: No discharge.        Left eye: No discharge.  Neck:     Thyroid: No thyroid mass or thyromegaly.     Vascular: No carotid bruit.  Cardiovascular:     Rate and Rhythm: Normal rate and regular rhythm.     Pulses: Normal pulses.     Heart sounds: Normal heart sounds.  Pulmonary:     Effort: Pulmonary effort is normal.      Breath sounds: Normal breath sounds.  Abdominal:     General: Bowel sounds are normal.     Palpations: Abdomen is soft.  Musculoskeletal:     Cervical back: Neck supple.  Right lower leg: No edema.     Left lower leg: No edema.  Skin:    General: Skin is warm.     Capillary Refill: Capillary refill takes less than 2 seconds.  Neurological:     General: No focal deficit present.     Mental Status: She is alert and oriented to person, place, and time.  Psychiatric:        Mood and Affect: Mood normal.     Labs reviewed: Recent Labs    12/18/23 0814 03/22/24 0832 06/21/24 0740 09/20/24 0746  NA 139 139 139 135  K 4.4 4.2 4.2 5.0  CL 103 102 103 99  CO2 27 31 28 29   GLUCOSE 94 157* 152* 360*  BUN 29* 33* 25 25  CREATININE 1.16* 1.36* 0.98 1.31*  CALCIUM  8.9 9.1 9.0 9.2  MG 2.3  --   --   --    Recent Labs    03/22/24 0832 09/20/24 0746  AST 18 14  ALT 14 12  BILITOT 0.7 0.6  PROT 6.9 6.7   Recent Labs    03/22/24 0832 09/20/24 0746  WBC 7.9 5.6  NEUTROABS 4,898 3,405  HGB 12.2 10.5*  HCT 37.7 33.2*  MCV 89.3 94.6  PLT 312 283   Lab Results  Component Value Date   TSH 1.24 09/20/2024   Lab Results  Component Value Date   HGBA1C 7.1 (H) 09/20/2024   Lab Results  Component Value Date   CHOL 140 09/20/2024   HDL 78 09/20/2024   LDLCALC 46 09/20/2024   TRIG 77 09/20/2024   CHOLHDL 1.8 09/20/2024    Significant Diagnostic Results in last 30 days:  No results found.  Assessment/Plan 1. Moderate nonproliferative diabetic retinopathy of both eyes without macular edema associated with type 2 diabetes mellitus (HCC) (Primary) - recent A1c 7.1, goal < 7.0 - using insulin  pump - unsuccessful trial long acting insulin  or oral antidiabetic medication - uses insulin  pump and invokana  - discussed plan if not able to manage insulin  pump> consider GLP-1 in future - canagliflozin  (INVOKANA ) 100 MG TABS tablet; Take 1 tablet (100 mg total) by mouth  daily before breakfast.  Dispense: 90 tablet; Refill: 2 - insulin  lispro (HUMALOG ) 100 UNIT/ML injection; Inject 0.4 mLs (40 Units total) into the skin daily. Uses insulin  pump Medtronic Mini Med Brand  Dispense: 40 mL; Refill: 3 - Basic Metabolic Panel - Microalbumin/Creatinine Ratio, Urine - Hemoglobin A1c  2. Essential hypertension - controlled, goal < 150/90 - BUN/creat 25/1.31 09/20/2024 - cont low sodium diet  - amLODipine  (NORVASC ) 10 MG tablet; Take 1 tablet (10 mg total) by mouth daily.  Dispense: 90 tablet; Refill: 2 - atorvastatin  (LIPITOR) 40 MG tablet; Take 1 tablet (40 mg total) by mouth daily.  Dispense: 90 tablet; Refill: 2 - furosemide  (LASIX ) 40 MG tablet; Take 1 tablet (40 mg total) by mouth daily.  Dispense: 90 tablet; Refill: 2 - valsartan  (DIOVAN ) 320 MG tablet; Take 1 tablet (320 mg total) by mouth daily.  Dispense: 90 tablet; Refill: 2 - metoprolol  succinate (TOPROL -XL) 50 MG 24 hr tablet; Take 1 tablet (50 mg total) by mouth daily. Take with or immediately following a meal.  Dispense: 90 tablet; Refill: 2 - CBC with Differential/Platelet  3. Hyperlipidemia associated with type 2 diabetes mellitus (HCC) - total 140, LDL 46, goal LDL < 70  - cont atorvastatin  - Lipid Panel  4. Diarrhea due to malabsorption - ongoing  - unsuccessful trial psyllium and metamucil in  past  - cholestyramine  (QUESTRAN ) 4 g packet; Take 1 packet (4 g total) by mouth daily.  Dispense: 90 each; Refill: 2  5. Stage 3b chronic kidney disease (HCC) - GFR 40 ( 03/2024)> was 48 - doxazosin  (CARDURA ) 1 MG tablet; Take 1 tablet (1 mg total) by mouth daily.  Dispense: 90 tablet; Refill: 2  6. Low hemoglobin - recent hgb 10.5> was 12.2  - recently donated blood  - denies blood loss - recheck cbc/diff next encounter   Family/ staff Communication: plan discussed with patient  Labs/tests ordered:  cbc/diff, cmp, A1c, lipid panel, urine microalbumin 01/24/2025

## 2024-10-02 ENCOUNTER — Encounter: Payer: Self-pay | Admitting: Orthopedic Surgery

## 2024-10-11 ENCOUNTER — Ambulatory Visit
Admission: RE | Admit: 2024-10-11 | Discharge: 2024-10-11 | Disposition: A | Source: Ambulatory Visit | Attending: Internal Medicine | Admitting: Internal Medicine

## 2024-10-11 DIAGNOSIS — Z1231 Encounter for screening mammogram for malignant neoplasm of breast: Secondary | ICD-10-CM

## 2024-11-05 ENCOUNTER — Encounter: Payer: Self-pay | Admitting: Orthopedic Surgery

## 2024-11-05 ENCOUNTER — Non-Acute Institutional Stay: Admitting: Orthopedic Surgery

## 2024-11-05 VITALS — BP 138/76 | HR 61 | Temp 98.0°F | Ht 60.0 in | Wt 189.0 lb

## 2024-11-05 DIAGNOSIS — Z0000 Encounter for general adult medical examination without abnormal findings: Secondary | ICD-10-CM

## 2024-11-05 NOTE — Patient Instructions (Signed)
 Cindy Lester,  Thank you for taking the time for your Medicare Wellness Visit. I appreciate your continued commitment to your health goals. Please review the care plan we discussed, and feel free to reach out if I can assist you further.  Please note that Annual Wellness Visits do not include a physical exam. Some assessments may be limited, especially if the visit was conducted virtually. If needed, we may recommend an in-person follow-up with your provider.  Ongoing Care Seeing your primary care provider every 3 to 6 months helps us  monitor your health and provide consistent, personalized care.   Referrals If a referral was made during today's visit and you haven't received any updates within two weeks, please contact the referred provider directly to check on the status.  Recommended Screenings:  Health Maintenance  Topic Date Due   Zoster (Shingles) Vaccine (1 of 2) 12/25/2024*   Eye exam for diabetics  12/01/2024   COVID-19 Vaccine (11 - Pfizer risk 2025-26 season) 01/20/2025   Hemoglobin A1C  03/20/2025   Yearly kidney health urinalysis for diabetes  06/21/2025   Yearly kidney function blood test for diabetes  09/20/2025   Complete foot exam   09/24/2025   Medicare Annual Wellness Visit  11/05/2025   DTaP/Tdap/Td vaccine (2 - Td or Tdap) 08/30/2030   Pneumococcal Vaccine for age over 78  Completed   Flu Shot  Completed   Osteoporosis screening with Bone Density Scan  Completed   Hepatitis C Screening  Completed   Meningitis B Vaccine  Aged Out   Breast Cancer Screening  Discontinued   Colon Cancer Screening  Discontinued  *Topic was postponed. The date shown is not the original due date.       11/05/2024   11:06 AM  Advanced Directives  Does Patient Have a Medical Advance Directive? Yes  Type of Estate Agent of [place];Out of facility DNR (pink MOST or yellow form);Living will  Does patient want to make changes to medical advance directive? No -  Patient declined  Copy of Healthcare Power of Attorney in Chart? Yes - validated most recent copy scanned in chart (See row information)    Vision: Annual vision screenings are recommended for early detection of glaucoma, cataracts, and diabetic retinopathy. These exams can also reveal signs of chronic conditions such as diabetes and high blood pressure.  Dental: Annual dental screenings help detect early signs of oral cancer, gum disease, and other conditions linked to overall health, including heart disease and diabetes.  Please see the attached documents for additional preventive care recommendations.   Prevnar 20> new pneumonia vaccine > get at local pharmacy  Recommend starting Caltrate for bone health> need calcium  with vitamin D   Plan for repeat bone density in 2-3 years

## 2024-11-05 NOTE — Progress Notes (Signed)
 "  Chief Complaint  Patient presents with   Medicare Wellness    AWV     Subjective:   Cindy Lester is a 78 y.o. female who presents for a Welcome to Medicare Exam.   Visit info / Clinical Intake: Medicare Wellness Visit Type:: Subsequent Annual Wellness Visit Persons participating in visit and providing information:: patient Medicare Wellness Visit Mode:: In-person (required for WTM) Interpreter Needed?: No Pre-visit prep was completed: no AWV questionnaire completed by patient prior to visit?: no Living arrangements:: lives with spouse/significant other  Fall Screening Falls in the past year?: 0 Number of falls in past year: 0 Was there an injury with Fall?: 0 Fall Risk Category Calculator: 0 Patient Fall Risk Level: Low Fall Risk  Fall Risk Patient at Risk for Falls Due to: No Fall Risks Fall risk Follow up: Falls evaluation completed  Cognitive Assessment Difficulty concentrating, remembering, or making decisions? : no Will 6CIT or Mini Cog be Completed: yes What year is it?: 0 points What month is it?: 0 points Give patient an address phrase to remember (5 components): [address] [place] . About what time is it?: 0 points Count backwards from 20 to 1: 0 points Say the months of the year in reverse: 0 points Repeat the address phrase from earlier: 0 points 6 CIT Score: 0 points  Advance Directives (For Healthcare) Does Patient Have a Medical Advance Directive?: Yes Does patient want to make changes to medical advance directive?: No - Patient declined Type of Advance Directive: Healthcare Power of [place]; Out of facility DNR (pink MOST or yellow form); Living will Copy of Healthcare Power of Attorney in Chart?: Yes - validated most recent copy scanned in chart (See row information) Copy of Living Will in Chart?: Yes - validated most recent copy scanned in chart (See row information) Out of facility DNR (pink MOST or yellow form) in Chart?  (Ambulatory ONLY): No - copy requested    Allergies (verified) Codeine, Hydrocodone , Hydrocodone -acetaminophen , Oxycodone , Penicillin g, Penicillins, Tape, Zoster vaccine live, and Amoxicillin   Current Medications (verified) Outpatient Encounter Medications as of 11/05/2024  Medication Sig   amLODipine  (NORVASC ) 10 MG tablet Take 1 tablet (10 mg total) by mouth daily.   atorvastatin  (LIPITOR) 40 MG tablet Take 1 tablet (40 mg total) by mouth daily.   B Complex Vitamins (B COMPLEX 50 PO) Take 1 tablet by mouth daily.   canagliflozin  (INVOKANA ) 100 MG TABS tablet Take 1 tablet (100 mg total) by mouth daily before breakfast.   Cholecalciferol (VITAMIN D3 PO) Take 2 capsules by mouth daily.   cholestyramine  (QUESTRAN ) 4 g packet Take 1 packet (4 g total) by mouth daily.   CINNAMON PO Take by mouth daily.   co-enzyme Q-10 50 MG capsule Take 100 mg by mouth daily.   cyanocobalamin  (VITAMIN B12) 1000 MCG tablet Take 1 tablet (1,000 mcg total) by mouth daily.   doxazosin  (CARDURA ) 1 MG tablet Take 1 tablet (1 mg total) by mouth daily.   fish oil-omega-3 fatty acids 1000 MG capsule Take 2 g by mouth daily.   furosemide  (LASIX ) 40 MG tablet Take 1 tablet (40 mg total) by mouth daily.   glucose blood (ACCU-CHEK GUIDE TEST) test strip 1 each by Other route 4 (four) times daily -  before meals and at bedtime. Use as instructed   insulin  lispro (HUMALOG ) 100 UNIT/ML injection Inject 0.4 mLs (40 Units total) into the skin daily. Uses insulin  pump Medtronic Mini Med Brand   Insulin   Pump Accessories MISC BASAL RATE 30 UNITS/DAY,DISPOSABLE SUBCUTANEOUS Pump: Medtronic Minimed   MAGNESIUM-ZINC PO Take by mouth daily.   metoprolol  succinate (TOPROL -XL) 50 MG 24 hr tablet Take 1 tablet (50 mg total) by mouth daily. Take with or immediately following a meal.   Multiple Vitamin (MULTIVITAMIN) capsule Take 1 capsule by mouth daily.     valsartan  (DIOVAN ) 320 MG tablet Take 1 tablet (320 mg total) by mouth  daily.   No facility-administered encounter medications on file as of 11/05/2024.    History: Past Medical History:  Diagnosis Date   Cataract    Colon polyps    Diabetes mellitus    Diverticulosis    GERD (gastroesophageal reflux disease)    occ-no meds   Hyperlipidemia    Hypertension    Neuropathy    Nonproliferative diabetic retinopathy of both eyes (HCC) 10/31/2020   Osteoarthritis    Tingling    neck   Wears glasses    Past Surgical History:  Procedure Laterality Date   ABDOMINAL HYSTERECTOMY  11/11/1985   ACDF     2021   COLONOSCOPY     GANGLION CYST EXCISION Right 03/17/2013   Procedure: EXCISION MASS RIGHT WRIST;  Surgeon: Arley JONELLE Curia, MD;  Location: [facility];  Service: Orthopedics;  Laterality: Right;   HAND SURGERY Right    right-as child   Family History  Problem Relation Age of Onset   Alcoholism Mother        died at age 39 in a fire - unsure of health history   Coronary artery disease Father    Bladder Cancer Father    Melanoma Father    Breast cancer Neg Hx    Colon cancer Neg Hx    Colon polyps Neg Hx    Esophageal cancer Neg Hx    Rectal cancer Neg Hx    Stomach cancer Neg Hx    BRCA 1/2 Neg Hx    Social History   Occupational History   Occupation: retired professor    Employer: [employer]  Tobacco Use   Smoking status: Never   Smokeless tobacco: Never  Vaping Use   Vaping status: Never Used  Substance and Sexual Activity   Alcohol  use: Yes    Comment: occaisonal wine   Drug use: No   Sexual activity: Not on file   Tobacco Counseling Counseling given: Not Answered  SDOH Screenings   Food Insecurity: No Food Insecurity (11/05/2024)  Housing: Unknown (11/05/2024)  Transportation Needs: No Transportation Needs (11/05/2024)  Utilities: Not At Risk (11/05/2024)  Alcohol  Screen: Low Risk (11/20/2023)  Depression (PHQ2-9): Low Risk (11/05/2024)  Financial Resource Strain: Low Risk (11/20/2023)  Physical  Activity: Sufficiently Active (11/20/2023)  Recent Concern: Physical Activity - Insufficiently Active (09/06/2023)  Social Connections: Moderately Integrated (11/20/2023)  Recent Concern: Social Connections - Moderately Isolated (09/06/2023)  Stress: No Stress Concern Present (11/20/2023)  Tobacco Use: Low Risk (11/05/2024)   See flowsheets for full screening details  Depression Screen PHQ 2 & 9 Depression Scale- Over the past 2 weeks, how often have you been bothered by any of the following problems? Little interest or pleasure in doing things: 0 Feeling down, depressed, or hopeless (PHQ Adolescent also includes...irritable): 0 PHQ-2 Total Score: 0      Goals Addressed   None          Objective:    Today's Vitals   11/05/24 1103  BP: 138/76  Pulse: 61  Temp: 98 F (36.7 C)  SpO2: 98%  Weight: 189 lb (85.7 kg)  Height: 5' (1.524 m)   Body mass index is 36.91 kg/m.   Physical Exam    Hearing/Vision screen Vision Screening - Comments:: Dr. Arley Ruder Last Exam: 11/2023 Immunizations and Health Maintenance Health Maintenance  Topic Date Due   Medicare Annual Wellness (AWV)  10/03/2023   Zoster Vaccines- Shingrix (1 of 2) 12/25/2024 (Originally 01/01/1965)   OPHTHALMOLOGY EXAM  12/01/2024   COVID-19 Vaccine (11 - Pfizer risk 2025-26 season) 01/20/2025   HEMOGLOBIN A1C  03/20/2025   Diabetic kidney evaluation - Urine ACR  06/21/2025   Diabetic kidney evaluation - eGFR measurement  09/20/2025   FOOT EXAM  09/24/2025   DTaP/Tdap/Td (2 - Td or Tdap) 08/30/2030   Pneumococcal Vaccine: 50+ Years  Completed   Influenza Vaccine  Completed   Bone Density Scan  Completed   Hepatitis C Screening  Completed   Meningococcal B Vaccine  Aged Out   Mammogram  Discontinued   Colonoscopy  Discontinued    EKG: normal EKG, normal sinus rhythm, unchanged from previous tracings     Assessment/Plan:  This is a routine wellness examination for Cindy Lester.  Patient Care Team: Gil Greig BRAVO, NP as PCP - General (Adult Health Nurse Practitioner) Ruder Arley LABOR, MD as Consulting Physician (Ophthalmology)  I have personally reviewed and noted the following in the patients chart:   Medical and social history Use of alcohol , tobacco or illicit drugs  Current medications and supplements including opioid prescriptions. Functional ability and status Nutritional status Physical activity Advanced directives List of other physicians Hospitalizations, surgeries, and ER visits in previous 12 months Vitals Screenings to include cognitive, depression, and falls Referrals and appointments  No orders of the defined types were placed in this encounter.  In addition, I have reviewed and discussed with patient certain preventive protocols, quality metrics, and best practice recommendations. A written personalized care plan for preventive services as well as general preventive health recommendations were provided to patient.   Greig BRAVO Gil, NP   11/05/2024   Nurse notes: UTD on vaccinations. Discussed Prevnar 20. Recommend adding Caltrate supplement for bone health/osteopenia.  "

## 2024-11-30 LAB — OPHTHALMOLOGY REPORT-SCANNED

## 2024-12-02 ENCOUNTER — Other Ambulatory Visit: Payer: Self-pay | Admitting: Orthopedic Surgery

## 2024-12-02 ENCOUNTER — Encounter: Payer: Self-pay | Admitting: Orthopedic Surgery

## 2024-12-02 ENCOUNTER — Other Ambulatory Visit: Payer: Self-pay

## 2024-12-02 DIAGNOSIS — E113393 Type 2 diabetes mellitus with moderate nonproliferative diabetic retinopathy without macular edema, bilateral: Secondary | ICD-10-CM

## 2024-12-02 MED ORDER — INSULIN LISPRO 100 UNIT/ML IJ SOLN
40.0000 [IU] | Freq: Every day | INTRAMUSCULAR | 3 refills | Status: AC
  Filled 2024-12-02 – 2024-12-03 (×2): qty 40, 90d supply, fill #0

## 2024-12-03 ENCOUNTER — Other Ambulatory Visit: Payer: Self-pay

## 2024-12-08 ENCOUNTER — Other Ambulatory Visit (HOSPITAL_COMMUNITY): Payer: Self-pay

## 2024-12-08 ENCOUNTER — Other Ambulatory Visit: Payer: Self-pay

## 2024-12-08 MED ORDER — INSULIN ASPART 100 UNIT/ML IJ SOLN
INTRAMUSCULAR | 99 refills | Status: AC
  Filled 2024-12-08: qty 30, 75d supply, fill #0

## 2024-12-08 NOTE — Telephone Encounter (Signed)
 Patient returned call and state she will try the alternative. Patient would like rx sent to [facility] with a not to pharmacy to call when ready for pick up

## 2024-12-08 NOTE — Telephone Encounter (Signed)
 Spoke with patients husband nad he expressed his frustration with this situation, and emphasized that they are leaving to go to [place] tomorrow and need to have her insulin  to manage diabetes. Patient was at the gym the time of call, however Mr.Abt will have her call or send a mychart message with her response to Amy's question

## 2024-12-09 ENCOUNTER — Other Ambulatory Visit: Payer: Self-pay

## 2025-01-14 ENCOUNTER — Encounter: Admitting: Orthopedic Surgery

## 2025-01-28 ENCOUNTER — Encounter: Admitting: Orthopedic Surgery
